# Patient Record
Sex: Male | Born: 1950 | Race: White | Hispanic: No | Marital: Married | Smoking: Never smoker
Health system: Southern US, Community
[De-identification: ages and names within clinical notes are randomized; demographics above are authoritative.]

## PROBLEM LIST (undated history)

## (undated) DIAGNOSIS — E079 Disorder of thyroid, unspecified: Secondary | ICD-10-CM

## (undated) DIAGNOSIS — E7800 Pure hypercholesterolemia, unspecified: Secondary | ICD-10-CM

## (undated) DIAGNOSIS — I350 Nonrheumatic aortic (valve) stenosis: Secondary | ICD-10-CM

## (undated) DIAGNOSIS — R011 Cardiac murmur, unspecified: Secondary | ICD-10-CM

## (undated) DIAGNOSIS — H05839 Thyrotoxicosis with diffuse goiter without thyrotoxic crisis or storm: Secondary | ICD-10-CM

## (undated) DIAGNOSIS — E0500 Thyrotoxicosis with diffuse goiter without thyrotoxic crisis or storm: Secondary | ICD-10-CM

## (undated) DIAGNOSIS — E119 Type 2 diabetes mellitus without complications: Secondary | ICD-10-CM

## (undated) DIAGNOSIS — I10 Essential (primary) hypertension: Secondary | ICD-10-CM

## (undated) HISTORY — PX: BACK SURGERY: SHX140

## (undated) HISTORY — PX: EYE SURGERY: SHX253

## (undated) HISTORY — PX: SHOULDER SURGERY: SHX246

## (undated) HISTORY — PX: OTHER SURGICAL HISTORY: SHX169

---

## 2006-05-04 ENCOUNTER — Ambulatory Visit: Payer: Self-pay | Admitting: Gastroenterology

## 2009-01-15 ENCOUNTER — Ambulatory Visit: Payer: Self-pay | Admitting: Internal Medicine

## 2009-02-02 ENCOUNTER — Ambulatory Visit: Payer: Self-pay | Admitting: Internal Medicine

## 2014-12-07 ENCOUNTER — Emergency Department: Payer: BLUE CROSS/BLUE SHIELD

## 2014-12-07 ENCOUNTER — Encounter: Payer: Self-pay | Admitting: *Deleted

## 2014-12-07 ENCOUNTER — Emergency Department
Admission: EM | Admit: 2014-12-07 | Discharge: 2014-12-07 | Disposition: A | Discharge status: Home / Self Care | Payer: BLUE CROSS/BLUE SHIELD | Attending: Emergency Medicine | Admitting: Emergency Medicine

## 2014-12-07 DIAGNOSIS — Y9389 Activity, other specified: Secondary | ICD-10-CM | POA: Insufficient documentation

## 2014-12-07 DIAGNOSIS — S3991XA Unspecified injury of abdomen, initial encounter: Secondary | ICD-10-CM | POA: Insufficient documentation

## 2014-12-07 DIAGNOSIS — S299XXA Unspecified injury of thorax, initial encounter: Secondary | ICD-10-CM | POA: Diagnosis present

## 2014-12-07 DIAGNOSIS — Z79899 Other long term (current) drug therapy: Secondary | ICD-10-CM | POA: Diagnosis not present

## 2014-12-07 DIAGNOSIS — Z794 Long term (current) use of insulin: Secondary | ICD-10-CM | POA: Insufficient documentation

## 2014-12-07 DIAGNOSIS — I10 Essential (primary) hypertension: Secondary | ICD-10-CM | POA: Diagnosis not present

## 2014-12-07 DIAGNOSIS — Y92410 Unspecified street and highway as the place of occurrence of the external cause: Secondary | ICD-10-CM | POA: Insufficient documentation

## 2014-12-07 DIAGNOSIS — E119 Type 2 diabetes mellitus without complications: Secondary | ICD-10-CM | POA: Insufficient documentation

## 2014-12-07 DIAGNOSIS — S2232XA Fracture of one rib, left side, initial encounter for closed fracture: Secondary | ICD-10-CM | POA: Diagnosis not present

## 2014-12-07 DIAGNOSIS — Y998 Other external cause status: Secondary | ICD-10-CM | POA: Diagnosis not present

## 2014-12-07 HISTORY — DX: Type 2 diabetes mellitus without complications: E11.9

## 2014-12-07 HISTORY — DX: Pure hypercholesterolemia, unspecified: E78.00

## 2014-12-07 HISTORY — DX: Essential (primary) hypertension: I10

## 2014-12-07 LAB — CBC
HEMATOCRIT: 41 % (ref 40.0–52.0)
Hemoglobin: 13.8 g/dL (ref 13.0–18.0)
MCH: 30.5 pg (ref 26.0–34.0)
MCHC: 33.7 g/dL (ref 32.0–36.0)
MCV: 90.5 fL (ref 80.0–100.0)
PLATELETS: 236 10*3/uL (ref 150–440)
RBC: 4.53 MIL/uL (ref 4.40–5.90)
RDW: 14.2 % (ref 11.5–14.5)
WBC: 8.9 10*3/uL (ref 3.8–10.6)

## 2014-12-07 LAB — COMPREHENSIVE METABOLIC PANEL
ALT: 27 U/L (ref 17–63)
ANION GAP: 9 (ref 5–15)
AST: 23 U/L (ref 15–41)
Albumin: 4.2 g/dL (ref 3.5–5.0)
Alkaline Phosphatase: 49 U/L (ref 38–126)
BILIRUBIN TOTAL: 0.6 mg/dL (ref 0.3–1.2)
BUN: 24 mg/dL — ABNORMAL HIGH (ref 6–20)
CO2: 25 mmol/L (ref 22–32)
Calcium: 9.2 mg/dL (ref 8.9–10.3)
Chloride: 106 mmol/L (ref 101–111)
Creatinine, Ser: 1.01 mg/dL (ref 0.61–1.24)
GFR calc Af Amer: >60 mL/min (ref >60)
Glucose, Bld: 256 mg/dL — ABNORMAL HIGH (ref 65–99)
POTASSIUM: 4.3 mmol/L (ref 3.5–5.1)
Sodium: 140 mmol/L (ref 135–145)
TOTAL PROTEIN: 7.2 g/dL (ref 6.5–8.1)

## 2014-12-07 MED ORDER — OXYCODONE-ACETAMINOPHEN 10-325 MG PO TABS: 1.0000 | ORAL_TABLET | Freq: Four times a day (QID) | ORAL | Status: AC | PRN

## 2014-12-07 MED ORDER — MELOXICAM 15 MG PO TABS: 15.0000 mg | ORAL_TABLET | Freq: Every day | ORAL | Status: DC

## 2014-12-07 MED ORDER — IOHEXOL 350 MG/ML SOLN
100.0000 mL | Freq: Once | INTRAVENOUS | Status: AC | PRN
  Administered 2014-12-07: 100 mL via INTRAVENOUS
  Filled 2014-12-07: qty 100

## 2014-12-07 MED ORDER — ONDANSETRON HCL 4 MG/2ML IJ SOLN
4.0000 mg | Freq: Once | INTRAMUSCULAR | Status: AC
  Administered 2014-12-07: 4 mg via INTRAVENOUS
  Filled 2014-12-07: qty 2

## 2014-12-07 MED ORDER — SODIUM CHLORIDE 0.9 % IV BOLUS (SEPSIS)
1000.0000 mL | Freq: Once | INTRAVENOUS | Status: AC
  Administered 2014-12-07: 1000 mL via INTRAVENOUS

## 2014-12-07 MED ORDER — MORPHINE SULFATE (PF) 4 MG/ML IV SOLN
4.0000 mg | Freq: Once | INTRAVENOUS | Status: AC
  Administered 2014-12-07: 4 mg via INTRAVENOUS
  Filled 2014-12-07: qty 1
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
## 2017-02-10 ENCOUNTER — Other Ambulatory Visit: Payer: Self-pay | Admitting: *Deleted

## 2017-02-10 DIAGNOSIS — R011 Cardiac murmur, unspecified: Secondary | ICD-10-CM

## 2017-02-16 ENCOUNTER — Encounter: Payer: Self-pay | Admitting: *Deleted

## 2017-02-17 ENCOUNTER — Encounter
Admission: RE | Disposition: A | Discharge status: Home / Self Care | Payer: Self-pay | Source: Ambulatory Visit | Attending: Internal Medicine

## 2017-02-17 ENCOUNTER — Ambulatory Visit
Admission: RE | Admit: 2017-02-17 | Discharge: 2017-02-17 | Disposition: A | Discharge status: Home / Self Care | Payer: Medicare Other | Source: Ambulatory Visit | Attending: Internal Medicine | Admitting: Internal Medicine

## 2017-02-17 ENCOUNTER — Ambulatory Visit: Payer: Medicare Other | Admitting: Anesthesiology

## 2017-02-17 ENCOUNTER — Other Ambulatory Visit: Payer: Self-pay

## 2017-02-17 ENCOUNTER — Encounter: Payer: Self-pay | Admitting: *Deleted

## 2017-02-17 DIAGNOSIS — I10 Essential (primary) hypertension: Secondary | ICD-10-CM | POA: Diagnosis not present

## 2017-02-17 DIAGNOSIS — E119 Type 2 diabetes mellitus without complications: Secondary | ICD-10-CM | POA: Insufficient documentation

## 2017-02-17 DIAGNOSIS — Z1211 Encounter for screening for malignant neoplasm of colon: Secondary | ICD-10-CM | POA: Diagnosis present

## 2017-02-17 DIAGNOSIS — Z7982 Long term (current) use of aspirin: Secondary | ICD-10-CM | POA: Insufficient documentation

## 2017-02-17 DIAGNOSIS — Z79899 Other long term (current) drug therapy: Secondary | ICD-10-CM | POA: Insufficient documentation

## 2017-02-17 DIAGNOSIS — K648 Other hemorrhoids: Secondary | ICD-10-CM | POA: Diagnosis not present

## 2017-02-17 DIAGNOSIS — E7800 Pure hypercholesterolemia, unspecified: Secondary | ICD-10-CM | POA: Insufficient documentation

## 2017-02-17 DIAGNOSIS — Z794 Long term (current) use of insulin: Secondary | ICD-10-CM | POA: Insufficient documentation

## 2017-02-17 DIAGNOSIS — R011 Cardiac murmur, unspecified: Secondary | ICD-10-CM | POA: Diagnosis not present

## 2017-02-17 HISTORY — PX: COLONOSCOPY WITH PROPOFOL: SHX5780

## 2017-02-17 HISTORY — DX: Cardiac murmur, unspecified: R01.1

## 2017-02-17 LAB — GLUCOSE, CAPILLARY: GLUCOSE-CAPILLARY: 122 mg/dL — AB (ref 65–99)

## 2017-02-17 SURGERY — COLONOSCOPY WITH PROPOFOL
Anesthesia: General

## 2017-02-17 MED ORDER — PROPOFOL 500 MG/50ML IV EMUL
INTRAVENOUS | Status: AC
  Filled 2017-02-17: qty 50

## 2017-02-17 MED ORDER — LIDOCAINE HCL (PF) 2 % IJ SOLN
INTRAMUSCULAR | Status: AC
  Filled 2017-02-17: qty 10

## 2017-02-17 MED ORDER — LIDOCAINE HCL (CARDIAC) 20 MG/ML IV SOLN
INTRAVENOUS | Status: DC | PRN
  Administered 2017-02-17: 100 mg via INTRAVENOUS

## 2017-02-17 MED ORDER — SODIUM CHLORIDE 0.9 % IV SOLN
INTRAVENOUS | Status: DC
  Administered 2017-02-17: 07:00:00 via INTRAVENOUS

## 2017-02-17 MED ORDER — PROPOFOL 10 MG/ML IV BOLUS
INTRAVENOUS | Status: DC | PRN
  Administered 2017-02-17: 90 mg via INTRAVENOUS

## 2017-02-17 MED ORDER — PROPOFOL 500 MG/50ML IV EMUL
INTRAVENOUS | Status: DC | PRN
  Administered 2017-02-17: 125 ug/kg/min via INTRAVENOUS

## 2017-02-17 NOTE — Interval H&P Note (Signed)
History and Physical Interval Note:  02/17/2017 7:56 AM  Frank Mckee  has presented today for surgery, with the diagnosis of COLON CANCER SCREEN  The various methods of treatment have been discussed with the patient and family. After consideration of risks, benefits and other options for treatment, the patient has consented to  Procedure(s): COLONOSCOPY WITH PROPOFOL (N/A) as a surgical intervention .  The patient's history has been reviewed, patient examined, no change in status, stable for surgery.  I have reviewed the patient's chart and labs.  Questions were answered to the patient's satisfaction.     [place], [place]

## 2017-02-17 NOTE — Anesthesia Preprocedure Evaluation (Signed)
Anesthesia Evaluation  Patient identified by MRN, date of birth, ID band Patient awake    Reviewed: Allergy & Precautions, H&P , NPO status , Patient's Chart, lab work & pertinent test results, reviewed documented beta blocker date and time   History of Anesthesia Complications Negative for: history of anesthetic complications  Airway Mallampati: III  TM Distance: >3 FB Neck ROM: full    Dental  (+) Dental Advidsory Given, Missing, Poor Dentition   Pulmonary neg pulmonary ROS,           Cardiovascular Exercise Tolerance: Good hypertension, (-) angina(-) CAD, (-) Past MI, (-) Cardiac Stents and (-) CABG (-) dysrhythmias + Valvular Problems/Murmurs      Neuro/Psych negative neurological ROS  negative psych ROS   GI/Hepatic negative GI ROS, Neg liver ROS,   Endo/Other  diabetes  Renal/GU negative Renal ROS  negative genitourinary   Musculoskeletal   Abdominal   Peds  Hematology negative hematology ROS (+)   Anesthesia Other Findings Past Medical History: No date: Diabetes mellitus without complication ([facility]) No date: Heart murmur No date: Hypercholesteremia No date: Hypertension   Reproductive/Obstetrics negative OB ROS                             Anesthesia Physical Anesthesia Plan  ASA: III  Anesthesia Plan: General   Post-op Pain Management:    Induction: Intravenous  PONV Risk Score and Plan: 2 and Propofol infusion  Airway Management Planned: Nasal Cannula  Additional Equipment:   Intra-op Plan:   Post-operative Plan:   Informed Consent: I have reviewed the patients History and Physical, chart, labs and discussed the procedure including the risks, benefits and alternatives for the proposed anesthesia with the patient or authorized representative who has indicated his/her understanding and acceptance.   Dental Advisory Given  Plan Discussed with: Anesthesiologist, CRNA  and Surgeon  Anesthesia Plan Comments:         Anesthesia Quick Evaluation

## 2017-02-17 NOTE — Transfer of Care (Signed)
Immediate Anesthesia Transfer of Care Note  Patient: Frank Mckee  Procedure(s) Performed: COLONOSCOPY WITH PROPOFOL (N/A )  Patient Location: Endoscopy Unit  Anesthesia Type:General  Level of Consciousness: sedated  Airway & Oxygen Therapy: Patient Spontanous Breathing and Patient connected to nasal cannula oxygen  Post-op Assessment: Report given to RN and Post -op Vital signs reviewed and stable  Post vital signs: Reviewed and stable  Last Vitals:  Vitals:   02/17/17 0704 02/17/17 0816  BP: 133/69 (!) 96/43  Pulse: 63 64  Resp: 16 11  Temp: (!) 35.9 C (!) 36.3 C  SpO2: 96% 97%    Last Pain:  Vitals:   02/17/17 0816  TempSrc: Tympanic         Complications: No apparent anesthesia complications

## 2017-02-17 NOTE — Anesthesia Postprocedure Evaluation (Signed)
Anesthesia Post Note  Patient: Frank Mckee  Procedure(s) Performed: COLONOSCOPY WITH PROPOFOL (N/A )  Patient location during evaluation: Endoscopy Anesthesia Type: General Level of consciousness: awake and alert Pain management: pain level controlled Vital Signs Assessment: post-procedure vital signs reviewed and stable Respiratory status: spontaneous breathing, nonlabored ventilation, respiratory function stable and patient connected to nasal cannula oxygen Cardiovascular status: blood pressure returned to baseline and stable Postop Assessment: no apparent nausea or vomiting Anesthetic complications: no     Last Vitals:  Vitals:   02/17/17 0826 02/17/17 0837  BP: (!) 102/45 (!) 129/59  Pulse: (!) 58 60  Resp: 12 18  Temp:    SpO2: 98% 96%    Last Pain:  Vitals:   02/17/17 0816  TempSrc: Tympanic                 Martha Clan

## 2017-02-17 NOTE — H&P (Signed)
Outpatient short stay form Pre-procedure 02/17/2017 7:55 AM Teodoro K. Alice Reichert, M.D.  Primary Physician: Thereasa Distance, M.D.  Reason for visit:  Colon cancer screening  History of present illness:  Patient presents for colon cancer screening. The patient denies abdominal pain, abnormal weight loss or rectal bleeding.     Current Facility-Administered Medications:  .  0.9 %  sodium chloride infusion, , Intravenous, Continuous, Minor, Benay Pike, MD, Last Rate: 20 mL/hr at 02/17/17 8032  Medications Prior to Admission  Medication Sig Dispense Refill Last Dose  . aspirin EC 81 MG tablet Take 81 mg by mouth daily.   Past Week at Unknown time  . ezetimibe (ZETIA) 10 MG tablet Take 10 mg by mouth daily.   02/16/2017 at Unknown time  . fexofenadine (ALLEGRA) 180 MG tablet Take 180 mg by mouth daily.   02/16/2017 at Unknown time  . losartan (COZAAR) 50 MG tablet Take 50 mg by mouth daily.   02/16/2017 at Unknown time  . meloxicam (MOBIC) 15 MG tablet Take 1 tablet (15 mg total) by mouth daily. 30 tablet 0 Past Week at Unknown time  . Multiple Vitamin (MULTIVITAMIN) tablet Take 1 tablet by mouth daily.   Past Week at Unknown time  . insulin aspart (NOVOLOG) 100 UNIT/ML injection Inject into the skin 3 (three) times daily before meals.        No Known Allergies   Past Medical History:  Diagnosis Date  . Diabetes mellitus without complication ([place])   . Heart murmur   . Hypercholesteremia   . Hypertension     Review of systems:      Physical Exam  General appearance: alert, cooperative and appears stated age Resp: clear to auscultation bilaterally Cardio: regular rate and rhythm, S1, S2 normal, no murmur, click, rub or gallop GI: soft, non-tender; bowel sounds normal; no masses,  no organomegaly Extremities: extremities normal, atraumatic, no cyanosis or edema     Planned procedures: Colonoscopy. The patient understands the nature of the planned procedure, indications, risks,  alternatives and potential complications including but not limited to bleeding, infection, perforation, damage to internal organs and possible oversedation/side effects from anesthesia. The patient agrees and gives consent to proceed.  Please refer to procedure notes for findings, recommendations and patient disposition/instructions.    Teodoro K. Alice Reichert, M.D. Gastroenterology 02/17/2017  7:55 AM

## 2017-02-17 NOTE — Op Note (Signed)
[facility] Gastroenterology Patient Name: Frank Mckee Procedure Date: 02/17/2017 7:32 AM MRN: [id number] Account #: [id number] Date of Birth: 1951-01-28 Admit Type: Outpatient Age: 67 Room: [facility] ENDO ROOM 3 Gender: Male Note Status: Finalized Procedure:            Colonoscopy Indications:          Screening for colorectal malignant neoplasm Providers:            Benay Pike. Quita Mcgrory MD, MD Medicines:            Propofol per Anesthesia Complications:        No immediate complications. Procedure:            Pre-Anesthesia Assessment:                       - The risks and benefits of the procedure and the                        sedation options and risks were discussed with the                        patient. All questions were answered and informed                        consent was obtained.                       - Patient identification and proposed procedure were                        verified prior to the procedure by the nurse. The                        procedure was verified in the procedure room.                       - ASA Grade Assessment: II - A patient with mild                        systemic disease.                       - After reviewing the risks and benefits, the patient                        was deemed in satisfactory condition to undergo the                        procedure.                       After obtaining informed consent, the colonoscope was                        passed under direct vision. Throughout the procedure,                        the patient's blood pressure, pulse, and oxygen                        saturations were monitored continuously. The  Colonoscope was introduced through the anus and                        advanced to the the cecum, identified by appendiceal                        orifice and ileocecal valve. The colonoscopy was                        performed without difficulty. The patient  tolerated the                        procedure well. The quality of the bowel preparation                        was good. Findings:      The perianal and digital rectal examinations were normal. Pertinent       negatives include normal sphincter tone and no palpable rectal lesions.      The colon (entire examined portion) appeared normal.      Non-bleeding internal hemorrhoids were found during retroflexion. The       hemorrhoids were mild. Impression:           - The entire examined colon is normal.                       - Non-bleeding internal hemorrhoids.                       - No specimens collected. Recommendation:       - Repeat colonoscopy in 10 years for screening purposes.                       - Return to GI office PRN. Procedure Code(s):    --- Professional ---                       O2947, Colorectal cancer screening; colonoscopy on                        individual not meeting criteria for high risk Diagnosis Code(s):    --- Professional ---                       Z12.11, Encounter for screening for malignant neoplasm                        of colon                       K64.8, Other hemorrhoids CPT copyright 2016 American Medical Association. All rights reserved. The codes documented in this report are preliminary and upon coder review may  be revised to meet current compliance requirements. Efrain Sella MD, MD 02/17/2017 8:16:33 AM This report has been signed electronically. Number of Addenda: 0 Note Initiated On: 02/17/2017 7:32 AM Scope Withdrawal Time: 0 hours 6 minutes 12 seconds  Total Procedure Duration: 0 hours 10 minutes 14 seconds       [facility]

## 2017-02-17 NOTE — Anesthesia Post-op Follow-up Note (Signed)
Anesthesia QCDR form completed.        

## 2017-02-18 ENCOUNTER — Encounter: Payer: Self-pay | Admitting: Internal Medicine

## 2017-03-10 ENCOUNTER — Ambulatory Visit (INDEPENDENT_AMBULATORY_CARE_PROVIDER_SITE_OTHER): Payer: Medicare Other

## 2017-03-10 ENCOUNTER — Other Ambulatory Visit: Payer: Self-pay

## 2017-03-10 DIAGNOSIS — R011 Cardiac murmur, unspecified: Secondary | ICD-10-CM

## 2017-03-10 LAB — ECHOCARDIOGRAM COMPLETE
AO mean calculated velocity dopler: 126 cm/s
AV Area VTI index: 1.13 cm2/m2
AV Area mean vel: 2.1 cm2
AV Mean grad: 9 mmHg
AV VEL mean LVOT/AV: 0.55
AVAREAMEANVIN: 1.05 cm2/m2
Ao-asc: 30 cm
Area-P 1/2: 3.79 cm2
CHL CUP AV VEL: 2.25
CHL CUP STROKE VOLUME: 67 mL
E decel time: 197 msec
EERAT: 13.37
FS: 34 % (ref 28–44)
IVS/LV PW RATIO, ED: 0.92
LA ID, A-P, ES: 40 mm
LA diam end sys: 40 mm
LA vol index: 39.3 mL/m2
LADIAMINDEX: 2 cm/m2
LAVOL: 78.6 mL
LAVOLA4C: 104 mL
LV E/e' medial: 13.37
LV E/e'average: 13.37
LV TDI E'MEDIAL: 8.92
LV sys vol index: 28 mL/m2
LV sys vol: 56 mL (ref 21–61)
LVDIAVOL: 123 mL (ref 62–150)
LVDIAVOLIN: 62 mL/m2
LVELAT: 7.18 cm/s
LVOT VTI: 22.9 cm
LVOT area: 3.8 cm2
LVOT peak VTI: 0.59 cm
LVOTD: 22 mm
LVOTSV: 87 mL
Lateral S' vel: 22.2 cm/s
MV Dec: 197
MV pk E vel: 96 m/s
MVPG: 4 mmHg
MVPKAVEL: 102 m/s
P 1/2 time: 58 ms
PW: 13 mm — AB (ref 0.6–1.1)
RV TAPSE: 29.8 mm
Simpson's disk: 54
TDI e' lateral: 7.18
VTI: 38.7 cm
Valve area index: 1.13
Valve area: 2.25 cm2

## 2017-08-16 ENCOUNTER — Encounter: Payer: Self-pay | Admitting: Emergency Medicine

## 2017-08-16 ENCOUNTER — Other Ambulatory Visit: Payer: Self-pay

## 2017-08-16 ENCOUNTER — Ambulatory Visit
Admission: EM | Admit: 2017-08-16 | Discharge: 2017-08-16 | Disposition: A | Discharge status: Home / Self Care | Payer: Medicare Other | Attending: Family Medicine | Admitting: Family Medicine

## 2017-08-16 DIAGNOSIS — H6022 Malignant otitis externa, left ear: Secondary | ICD-10-CM | POA: Diagnosis not present

## 2017-08-16 MED ORDER — NEOMYCIN-POLYMYXIN-HC 3.5-10000-1 OT SUSP: 4.0000 [drp] | Freq: Three times a day (TID) | OTIC | 0 refills | Status: DC

## 2017-08-16 NOTE — ED Triage Notes (Signed)
Patient c/o left ear pain that started 1 week ago. Denies cold symptoms, denies fever.

## 2017-08-16 NOTE — ED Provider Notes (Signed)
[facility]    CSN: [id number] Arrival date & time: 08/16/17  0959     History   Chief Complaint Chief Complaint  Patient presents with  . Otalgia    HPI Frank Mckee is a 67 y.o. male.   The history is provided by the patient.  Otalgia  Location:  Left Behind ear:  No abnormality Quality:  Aching Severity:  Moderate Onset quality:  Sudden Duration:  1 week Timing:  Constant Progression:  Worsening Chronicity:  New Context: not direct blow, not elevation change, not foreign body in ear, not loud noise, not recent URI and not water in ear   Relieved by:  None tried Worsened by:  Nothing Ineffective treatments:  None tried Associated symptoms: no abdominal pain, no congestion, no cough, no diarrhea, no ear discharge, no fever, no headaches, no hearing loss, no neck pain, no rash, no rhinorrhea, no sore throat, no tinnitus and no vomiting   Risk factors: no recent travel, no chronic ear infection and no prior ear surgery     Past Medical History:  Diagnosis Date  . Diabetes mellitus without complication ([place])   . Heart murmur   . Hypercholesteremia   . Hypertension     There are no active problems to display for this patient.   Past Surgical History:  Procedure Laterality Date  . arm surgery    . BACK SURGERY    . COLONOSCOPY WITH PROPOFOL N/A 02/17/2017   Procedure: COLONOSCOPY WITH PROPOFOL;  Surgeon: Toledo, Benay Pike, MD;  Location: [facility] ENDOSCOPY;  Service: Gastroenterology;  Laterality: N/A;  . EYE SURGERY     cataract extraction  . SHOULDER SURGERY         Home Medications    Prior to Admission medications   Medication Sig Start Date End Date Taking? Authorizing Provider  aspirin EC 81 MG tablet Take 81 mg by mouth daily.   Yes [provider]  ezetimibe (ZETIA) 10 MG tablet Take 10 mg by mouth daily.   Yes [provider]  fexofenadine (ALLEGRA) 180 MG tablet Take 180 mg by mouth daily.   Yes [provider]  insulin aspart (NOVOLOG) 100 UNIT/ML injection Inject into the skin 3 (three) times daily before meals.   Yes [provider]  losartan (COZAAR) 50 MG tablet Take 50 mg by mouth daily.   Yes [provider]  Multiple Vitamin (MULTIVITAMIN) tablet Take 1 tablet by mouth daily.   Yes [provider]  meloxicam (MOBIC) 15 MG tablet Take 1 tablet (15 mg total) by mouth daily. 12/07/14   Cuthriell, Charline Bills, PA-C  neomycin-polymyxin-hydrocortisone (CORTISPORIN) 3.5-10000-1 OTIC suspension Place 4 drops into the left ear 3 (three) times daily. 08/16/17   Norval Gable, MD    Family History Family History  Problem Relation Age of Onset  . Healthy Mother   . Diabetes Father   . Hypertension Father     Social History Social History   Tobacco Use  . Smoking status: Never Smoker  . Smokeless tobacco: Never Used  Substance Use Topics  . Alcohol use: No  . Drug use: No     Allergies   Patient has no known allergies.   Review of Systems Review of Systems  Constitutional: Negative for fever.  HENT: Positive for ear pain. Negative for congestion, ear discharge, hearing loss, rhinorrhea, sore throat and tinnitus.   Respiratory: Negative for cough.   Gastrointestinal: Negative for abdominal pain, diarrhea and vomiting.  Musculoskeletal: Negative  for neck pain.  Skin: Negative for rash.  Neurological: Negative for headaches.     Physical Exam Triage Vital Signs ED Triage Vitals  Enc Vitals Group     BP 08/16/17 1022 (!) 151/64     Pulse Rate 08/16/17 1022 61     Resp 08/16/17 1022 18     Temp 08/16/17 1022 98.2 F (36.8 C)     Temp Source 08/16/17 1022 Oral     SpO2 08/16/17 1022 97 %     Weight 08/16/17 1018 190 lb (86.2 kg)     Height 08/16/17 1018 5\' 9"  (1.753 m)     Head Circumference --      Peak Flow --      Pain Score 08/16/17 1017 5     Pain Loc --      Pain Edu? --      Excl. in [place]? --    No data found.  Updated Vital  Signs BP (!) 151/64 (BP Location: Left Arm)   Pulse 61   Temp 98.2 F (36.8 C) (Oral)   Resp 18   Ht 5\' 9"  (1.753 m)   Wt 190 lb (86.2 kg)   SpO2 97%   BMI 28.06 kg/m   Visual Acuity Right Eye Distance:   Left Eye Distance:   Bilateral Distance:    Right Eye Near:   Left Eye Near:    Bilateral Near:     Physical Exam  Constitutional: He appears well-developed and well-nourished. No distress.  HENT:  Head: Normocephalic and atraumatic.  Nose: Nose normal.  Mouth/Throat: Uvula is midline, oropharynx is clear and moist and mucous membranes are normal. No tonsillar exudate.  Left ear canal swollen, erythematous and drainage  Skin: He is not diaphoretic.  Nursing note and vitals reviewed.    UC Treatments / Results  Labs (all labs ordered are listed, but only abnormal results are displayed) Labs Reviewed - No data to display  EKG None  Radiology No results found.  Procedures Procedures (including critical care time)  Medications Ordered in UC Medications - No data to display  Initial Impression / Assessment and Plan / UC Course  I have reviewed the triage vital signs and the nursing notes.  Pertinent labs & imaging results that were available during my care of the patient were reviewed by me and considered in my medical decision making (see chart for details).      Final Clinical Impressions(s) / UC Diagnoses   Final diagnoses:  Acute malignant otitis externa of left ear    ED Prescriptions    Medication Sig Dispense Auth. Provider   neomycin-polymyxin-hydrocortisone (CORTISPORIN) 3.5-10000-1 OTIC suspension Place 4 drops into the left ear 3 (three) times daily. 10 mL Norval Gable, MD     1. diagnosis reviewed with patient 2. rx as per orders above; reviewed possible side effects, interactions, risks and benefits  3. Recommend supportive treatment with otc analgesics prn 4. Follow-up prn if symptoms worsen or don't improve  Controlled Substance  Prescriptions [place] Controlled Substance Registry consulted? Not Applicable   Norval Gable, MD 08/16/17 1155

## 2018-02-14 ENCOUNTER — Other Ambulatory Visit: Payer: Self-pay | Admitting: Orthopedic Surgery

## 2018-02-14 DIAGNOSIS — M25511 Pain in right shoulder: Secondary | ICD-10-CM

## 2018-02-15 ENCOUNTER — Other Ambulatory Visit: Payer: Self-pay | Admitting: Orthopedic Surgery

## 2018-02-15 DIAGNOSIS — M25511 Pain in right shoulder: Secondary | ICD-10-CM

## 2018-02-24 ENCOUNTER — Ambulatory Visit
Admission: RE | Admit: 2018-02-24 | Discharge: 2018-02-24 | Disposition: A | Discharge status: Home / Self Care | Payer: Medicare Other | Source: Ambulatory Visit | Attending: Orthopedic Surgery | Admitting: Orthopedic Surgery

## 2018-02-24 DIAGNOSIS — M25511 Pain in right shoulder: Secondary | ICD-10-CM

## 2019-05-15 ENCOUNTER — Telehealth: Payer: Self-pay | Admitting: Cardiovascular Disease

## 2019-05-15 DIAGNOSIS — R000 Tachycardia, unspecified: Secondary | ICD-10-CM

## 2019-05-15 DIAGNOSIS — M7989 Other specified soft tissue disorders: Secondary | ICD-10-CM

## 2019-05-15 NOTE — Telephone Encounter (Signed)
Patient received referral from PCP office to be seen by cardiologist. He does have appointment coming up on 05/29/19 with Dr. Rockey Situ and verbal orders given for long term monitor and echocardiogram. Will enter orders and then send to scheduling to assist with getting these done.

## 2019-05-17 ENCOUNTER — Other Ambulatory Visit: Payer: Self-pay

## 2019-05-17 ENCOUNTER — Ambulatory Visit (INDEPENDENT_AMBULATORY_CARE_PROVIDER_SITE_OTHER): Payer: Medicare Other

## 2019-05-17 DIAGNOSIS — R000 Tachycardia, unspecified: Secondary | ICD-10-CM

## 2019-05-17 DIAGNOSIS — I350 Nonrheumatic aortic (valve) stenosis: Secondary | ICD-10-CM | POA: Diagnosis not present

## 2019-05-17 DIAGNOSIS — E119 Type 2 diabetes mellitus without complications: Secondary | ICD-10-CM

## 2019-05-17 DIAGNOSIS — E785 Hyperlipidemia, unspecified: Secondary | ICD-10-CM

## 2019-05-17 DIAGNOSIS — I359 Nonrheumatic aortic valve disorder, unspecified: Secondary | ICD-10-CM | POA: Diagnosis not present

## 2019-05-17 DIAGNOSIS — M7989 Other specified soft tissue disorders: Secondary | ICD-10-CM

## 2019-05-17 DIAGNOSIS — R609 Edema, unspecified: Secondary | ICD-10-CM | POA: Diagnosis not present

## 2019-05-17 DIAGNOSIS — I10 Essential (primary) hypertension: Secondary | ICD-10-CM

## 2019-05-18 NOTE — Telephone Encounter (Signed)
Monitor fell off and [place] and [place] to [place] on Sunday. Will have another monitor mailed to him which should be there when he returns home. Advised that he just mails the other one back when he returns home. He verbalized understanding with no further questions at this time.

## 2019-05-24 ENCOUNTER — Other Ambulatory Visit: Payer: Self-pay

## 2019-05-24 ENCOUNTER — Encounter: Payer: Self-pay | Admitting: Cardiovascular Disease

## 2019-05-24 ENCOUNTER — Ambulatory Visit (INDEPENDENT_AMBULATORY_CARE_PROVIDER_SITE_OTHER): Payer: Medicare Other | Admitting: Cardiovascular Disease

## 2019-05-24 VITALS — BP 122/56 | HR 95 | Ht 69.0 in | Wt 196.5 lb

## 2019-05-24 DIAGNOSIS — I10 Essential (primary) hypertension: Secondary | ICD-10-CM

## 2019-05-24 DIAGNOSIS — E119 Type 2 diabetes mellitus without complications: Secondary | ICD-10-CM

## 2019-05-24 DIAGNOSIS — E782 Mixed hyperlipidemia: Secondary | ICD-10-CM

## 2019-05-24 DIAGNOSIS — E0590 Thyrotoxicosis, unspecified without thyrotoxic crisis or storm: Secondary | ICD-10-CM

## 2019-05-24 DIAGNOSIS — I479 Paroxysmal tachycardia, unspecified: Secondary | ICD-10-CM

## 2019-05-24 DIAGNOSIS — I350 Nonrheumatic aortic (valve) stenosis: Secondary | ICD-10-CM

## 2019-05-24 DIAGNOSIS — Z794 Long term (current) use of insulin: Secondary | ICD-10-CM

## 2019-05-24 MED ORDER — PROPRANOLOL HCL ER 60 MG PO CP24: 60.0000 mg | ORAL_CAPSULE | Freq: Every day | ORAL | 3 refills | Status: DC

## 2019-05-24 MED ORDER — FUROSEMIDE 20 MG PO TABS: 20.0000 mg | ORAL_TABLET | Freq: Every day | ORAL | 3 refills | Status: DC | PRN

## 2019-05-24 NOTE — Progress Notes (Addendum)
Cardiology Office Note  Date:  05/24/2019   ID:  Frank Mckee, Frank Mckee 03-15-1950, MRN [id number]  PCP:  Sofie Hartigan, MD   Chief Complaint  Patient presents with  . other    Follow up from Echo results. Meds reviewed by the pt. verbally. Pt. c/o rapid heart beats and shortness of breath.     HPI:  [name] is a 69 year old gentleman with past medical history of Diabetes type 1, on insulin pump Hyperlipidemia Hypertension Thyroid disease Who presents by referral from primary care for tachycardia, shortness of breath  Recent visit to primary care with symptoms of shortness of breath and tachycardia Reported heart rate typically 60s, since March it has been running 90-100 Since his procedure for Dupuytren's contracture heart rate has been elevated Notes from primary care indicating he has a history of aortic valve stenosis  Recent lab work reviewed TSH less than 0.01, performed May 15, 2019 by primary care Free T4 3.44 Hemoglobin A1c 6.1 Total cholesterol 185 LDL 124 Prior cholesterol 118 in August 2017  He reports that he has lost 10 pounds in the past month  It appears she was started on methimazole 10 mg daily by endocrine on May 17 2019 1 week on methimazole,Tremor little better, Less tachycardia Still does not feel he is at baseline  Shortness of breath pressure up into his throat when bending over lifting something heavy with tachycardia as well  EKG personally reviewed by myself on todays visit Shows normal sinus rhythm rate 95 bpm no significant ST-T wave changes  Testing reviewed with him in detail Echocardiogram May 17, 2019 1. Left ventricular ejection fraction, by estimation, is 60 to 65%. The  left ventricle has normal function. The left ventricle has no regional  wall motion abnormalities. There is mild left ventricular hypertrophy.  Left ventricular diastolic parameters  are consistent with Grade II diastolic dysfunction  (pseudonormalization).  2. Right ventricular systolic function is normal. The right ventricular  size is mildly enlarged. There is mildly elevated pulmonary artery  systolic pressure.  3. Left atrial size was mildly dilated.  4. The mitral valve is grossly normal. Trivial mitral valve  regurgitation.  5. The aortic valve is tricuspid. Aortic valve regurgitation is not  visualized. Moderate to severe aortic valve stenosis. Aortic valve mean  gradient measures 36.0 mmHg. Aortic valve Vmax measures 3.95 m/s.  Echocardiogram 2019 Peak velocity (S): 252 cm/s. Mean gradient  (S): 14 mm Hg. Peak gradient (S): 25 mm Hg.   CT scan November 2016 chest abdomen pelvis with no significant coronary calcification, minimal abdominal aortic calcification  PMH:   has a past medical history of Diabetes mellitus without complication ([place]), Heart murmur, Hypercholesteremia, and Hypertension.  PSH:    Past Surgical History:  Procedure Laterality Date  . arm surgery    . BACK SURGERY    . COLONOSCOPY WITH PROPOFOL N/A 02/17/2017   Procedure: COLONOSCOPY WITH PROPOFOL;  Surgeon: Toledo, Benay Pike, MD;  Location: [facility] ENDOSCOPY;  Service: Gastroenterology;  Laterality: N/A;  . EYE SURGERY     cataract extraction  . SHOULDER SURGERY      Current Outpatient Medications  Medication Sig Dispense Refill  . aspirin EC 81 MG tablet Take 81 mg by mouth daily.    Marland Kitchen ezetimibe (ZETIA) 10 MG tablet Take 10 mg by mouth daily.    . fexofenadine (ALLEGRA) 180 MG tablet Take 180 mg by mouth daily.    . insulin aspart (NOVOLOG) 100 UNIT/ML injection  Inject into the skin 3 (three) times daily before meals.    Marland Kitchen losartan (COZAAR) 50 MG tablet Take 0.5 tablet (25 mg) by mouth once daily in the morning    . meloxicam (MOBIC) 15 MG tablet Take 1 tablet (15 mg total) by mouth daily. 30 tablet 0  . methimazole (TAPAZOLE) 10 MG tablet Take 10 mg by mouth daily.    . Multiple Vitamin (MULTIVITAMIN) tablet Take 1 tablet by  mouth daily.    . naftifine (NAFTIN) 1 % cream Apply topically.    . tadalafil (CIALIS) 20 MG tablet Take by mouth.    . tamsulosin (FLOMAX) 0.4 MG CAPS capsule Take 0.4 mg by mouth daily.    Marland Kitchen triamcinolone cream (KENALOG) 0.1 % Apply topically.    . propranolol ER (INDERAL LA) 60 MG 24 hr capsule Take 1 capsule (60 mg total) by mouth daily. 30 capsule 3   No current facility-administered medications for this visit.     Allergies:   Atorvastatin   Social History:  The patient  reports that he has never smoked. He has never used smokeless tobacco. He reports that he does not drink alcohol or use drugs.   Family History:   family history includes Diabetes in his father; Healthy in his mother; Hypertension in his father.    Review of Systems: Review of Systems  Constitutional: Negative.   HENT: Negative.   Respiratory: Positive for shortness of breath.   Cardiovascular: Positive for palpitations.  Gastrointestinal: Negative.   Musculoskeletal: Negative.   Neurological: Positive for tremors.  Psychiatric/Behavioral: Negative.   All other systems reviewed and are negative.   PHYSICAL EXAM: VS:  BP (!) 122/56 (BP Location: Left Arm, Patient Position: Sitting, Cuff Size: Normal)   Pulse 95   Ht 5\' 9"  (1.753 m)   Wt 196 lb 8 oz (89.1 kg)   SpO2 98%   BMI 29.02 kg/m  , BMI Body mass index is 29.02 kg/m. GEN: Well nourished, well developed, in no acute distress HEENT: normal Neck: no JVD, carotid bruits, or masses Cardiac: RRR; no murmurs, rubs, or gallops,no edema  Respiratory:  clear to auscultation bilaterally, normal work of breathing GI: soft, nontender, nondistended, + BS MS: no deformity or atrophy Skin: warm and dry, no rash Neuro:  Strength and sensation are intact Psych: euthymic mood, full affect   Recent Labs: No results found for requested labs within last 8760 hours.    Lipid Panel No results found for: CHOL, HDL, LDLCALC, TRIG    Wt Readings from Last  3 Encounters:  05/24/19 196 lb 8 oz (89.1 kg)  08/16/17 190 lb (86.2 kg)  02/17/17 185 lb (83.9 kg)     ASSESSMENT AND PLAN:  Problem List Items Addressed This Visit    None    Visit Diagnoses    Hyperthyroidism    -  Primary   Relevant Medications   methimazole (TAPAZOLE) 10 MG tablet   propranolol ER (INDERAL LA) 60 MG 24 hr capsule   Diabetes mellitus type 2, insulin dependent (HCC)       Mixed hyperlipidemia       Relevant Medications   tadalafil (CIALIS) 20 MG tablet   propranolol ER (INDERAL LA) 60 MG 24 hr capsule   Aortic valve stenosis, etiology of cardiac valve disease unspecified       Relevant Medications   tadalafil (CIALIS) 20 MG tablet   propranolol ER (INDERAL LA) 60 MG 24 hr capsule   Other Relevant Orders  EKG 12-Lead   Essential hypertension       Relevant Medications   tadalafil (CIALIS) 20 MG tablet   propranolol ER (INDERAL LA) 60 MG 24 hr capsule   Paroxysmal tachycardia (HCC)       Relevant Medications   tadalafil (CIALIS) 20 MG tablet   propranolol ER (INDERAL LA) 60 MG 24 hr capsule   Other Relevant Orders   EKG 12-Lead     Hyperthyroidism Lab work reviewed with him performed by endocrine Appears endocrine has started methimazole 10 mg daily He has been on this for the past week with mild improvement in his symptoms, still with some tremor and tachycardia rate still in the 90s -Recommend he start propranolol extended release 60 mg daily --Etiology of his hypothyroidism unclear, recommend he follow-up with endocrine May need thyroid ultrasound, follow-up lab work Unclear if this was exacerbated by recent anesthesia for Dupuytren's contracture or from his Covid vaccine  Essential hypertension Decrease losartan down to 25 mg daily, propranolol as above  Tachycardia Sinus tachycardia, secondary to hyperthyroidism as detailed above  Moderate to severe aortic valve stenosis Marked progression in his aortic valve stenosis over the past 2  years Likely having more symptoms in the setting of his hyperthyroidism and tachycardia -We recommended repeat echocardiogram on annual basis -We did discuss various treatment options for his aortic valve stenosis including surgical intervention and noninvasive options such as TAVR  Hyperlipidemia Numbers well controlled, CT scan chest abdomen pelvis reviewed with minimal coronary calcification, minimal aortic atherosclerosis  Diabetes type 2 on insulin Hemoglobin A1c well controlled, down to 6  Lower extremity edema Likely secondary to recent tachycardia in the setting of aortic valve stenosis We will prescribe Lasix 20 mg to take as needed for worsening leg swelling He prefers no potassium pill, he eats lots of bananas  Disposition:   F/U  3 months  Total encounter time more than 60 minutes Greater than 50% was spent in counseling and coordination of care with the patient  Signed, Esmond Plants, M.D., Ph.D. [place], [place]

## 2019-05-24 NOTE — Addendum Note (Signed)
Addended by: Minna Merritts on: 05/24/2019 01:36 PM   Modules accepted: Orders

## 2019-05-24 NOTE — Patient Instructions (Addendum)
Medication Instructions:  Your physician has recommended you make the following change in your medication:   1) Decrease losartan 50 mg- take 1/2 tablet (25 mg) by mouth once daily in the morning  2) Start propranolol ER 60 mg- take 1 capsule (60 mg) by mouth once daily in the evening  If you need a refill on your cardiac medications before your next appointment, please call your pharmacy.    Lab work: No new labs needed   If you have labs (blood work) drawn today and your tests are completely normal, you will receive your results only by: Marland Kitchen MyChart Message (if you have MyChart) OR . A paper copy in the mail If you have any lab test that is abnormal or we need to change your treatment, we will call you to review the results.   Testing/Procedures: No new testing needed   Follow-Up: At [facility], you and your health needs are our priority.  As part of our continuing mission to provide you with exceptional heart care, we have created designated Provider Care Teams.  These Care Teams include your primary Cardiologist (physician) and Advanced Practice Providers (APPs -  Physician Assistants and Nurse Practitioners) who all work together to provide you with the care you need, when you need it.  . You will need a follow up appointment in 3 months  . Providers on your designated Care Team:   . Murray Hodgkins, NP . Christell Faith, PA-C . Marrianne Mood, PA-C  Any Other Special Instructions Will Be Listed Below (If Applicable).  For educational health videos Log in to : [email or web address] Or : [email or web address], password : [email or web address]

## 2019-05-29 ENCOUNTER — Ambulatory Visit: Payer: Medicare Other | Admitting: Cardiovascular Disease

## 2019-05-29 ENCOUNTER — Telehealth: Payer: Self-pay | Admitting: Cardiovascular Disease

## 2019-05-29 DIAGNOSIS — E0590 Thyrotoxicosis, unspecified without thyrotoxic crisis or storm: Secondary | ICD-10-CM

## 2019-05-29 NOTE — Telephone Encounter (Signed)
Spoke with patient and he did want to move forward with getting ultrasound of his thyroid. Order placed and was going to provide him with number to call for scheduling but [place] to write on. Advised I could call his daughter and he was agreeable. Called and spoke with daughter Leafy Ro and provided her with number to schedule at [phone]. She verbalized understanding with no further questions at this time.

## 2019-06-05 ENCOUNTER — Ambulatory Visit: Payer: Medicare Other | Admitting: Cardiovascular Disease

## 2019-06-13 ENCOUNTER — Other Ambulatory Visit: Payer: Self-pay

## 2019-06-13 ENCOUNTER — Ambulatory Visit
Admission: RE | Admit: 2019-06-13 | Discharge: 2019-06-13 | Disposition: A | Discharge status: Home / Self Care | Payer: Medicare Other | Source: Ambulatory Visit | Attending: Cardiovascular Disease | Admitting: Cardiovascular Disease

## 2019-06-13 DIAGNOSIS — E0590 Thyrotoxicosis, unspecified without thyrotoxic crisis or storm: Secondary | ICD-10-CM | POA: Diagnosis present

## 2019-08-11 ENCOUNTER — Other Ambulatory Visit: Payer: Self-pay | Admitting: Cardiovascular Disease

## 2019-08-21 NOTE — Progress Notes (Signed)
Cardiology Office Note  Date:  08/22/2019   ID:  Frank Mckee, Frank Mckee [birth year], MRN [id number]  PCP:  Sofie Hartigan, MD   Chief Complaint  Patient presents with   other    3 month follow up. Meds reviewed by the pt. verbally. "doing well."     HPI:  Mr. Rhyan Radler is a 69 year old gentleman with past medical history of Diabetes type 1, on insulin pump Hyperlipidemia Hypertension Thyroid disease/Graves' Who presents by referral from primary care for tachycardia, shortness of breath, hyper thyroidism,  severe aortic valve stenosis  Last seen in clinic April 2021 by myself At that time had tachycardia, shortness of breath, TSH less than 0.01, weight loss  Echocardiogram April 2021 normal LV function, grade 2 diastolic dysfunction, moderate to severe aortic valve stenosis mean gradient 36 mmHg  He was started on extended release propranolol 60 mg daily, had follow-up with endocrine, Methimazole had been started 10 mg daily Losartan was decreased down to 25 daily Lasix as needed for leg swelling  In follow-up, propranolol held secondary to improved heart rate A1c 6.1 Back on losartan 50  Notes from endocrine reviewed personally by myself TSH was rechecked on visit with endocrine July 31, 2019 TSH 17.59 Graves' disease based on antibody testing On methimazole 3 days then down to 5 mg daily  Weight has stabilized [place] He does notice more shortness of breath seems to give out more  Long discussion concerning echocardiogram dated April 2021 showing  severe aortic valve stenosis mean gradient 36 mmHg, velocity close to 4 m/s  EKG personally reviewed by myself on todays visit Normal sinus rhythm no significant ST-T wave changes  Past medical history reviewed CT scan November 2016 chest abdomen pelvis with no significant coronary calcification, minimal abdominal aortic calcification  PMH:   has a past  medical history of Diabetes mellitus without complication ([place]), Heart murmur, Hypercholesteremia, and Hypertension.  PSH:    Past Surgical History:  Procedure Laterality Date   arm surgery     BACK SURGERY     COLONOSCOPY WITH PROPOFOL N/A 02/17/2017   Procedure: COLONOSCOPY WITH PROPOFOL;  Surgeon: Toledo, Benay Pike, MD;  Location: [facility] ENDOSCOPY;  Service: Gastroenterology;  Laterality: N/A;   EYE SURGERY     cataract extraction   SHOULDER SURGERY      Current Outpatient Medications  Medication Sig Dispense Refill   aspirin EC 81 MG tablet Take 81 mg by mouth daily.     ezetimibe (ZETIA) 10 MG tablet Take 10 mg by mouth daily.     fexofenadine (ALLEGRA) 180 MG tablet Take 180 mg by mouth daily.     furosemide (LASIX) 20 MG tablet Take 1 tablet (20 mg total) by mouth daily as needed. 90 tablet 3   insulin aspart (NOVOLOG) 100 UNIT/ML injection Inject into the skin 3 (three) times daily before meals.     losartan (COZAAR) 50 MG tablet Take 1 tablet (50 mg total) by mouth daily. 90 tablet 3   meloxicam (MOBIC) 15 MG tablet Take 1 tablet (15 mg total) by mouth daily. 30 tablet 0   methimazole (TAPAZOLE) 10 MG tablet Take 5 mg by mouth daily.      Multiple Vitamin (MULTIVITAMIN) tablet Take 1 tablet by mouth daily.     naftifine (NAFTIN) 1 % cream Apply topically.     propranolol ER (INDERAL LA) 60 MG 24 hr capsule TAKE 1 CAPSULE(60 MG)  BY MOUTH DAILY 90 capsule 0   tadalafil (CIALIS) 20 MG tablet Take by mouth.     tamsulosin (FLOMAX) 0.4 MG CAPS capsule Take 0.4 mg by mouth daily.     triamcinolone cream (KENALOG) 0.1 % Apply topically.     No current facility-administered medications for this visit.     Allergies:   Atorvastatin   Social History:  The patient  reports that he has never smoked. He has never used smokeless tobacco. He reports that he does not drink alcohol and does not use drugs.   Family History:   family history includes Diabetes in his  father; Healthy in his mother; Hypertension in his father.    Review of Systems: Review of Systems  Constitutional: Negative.   HENT: Negative.   Respiratory: Positive for shortness of breath.   Cardiovascular: Negative.   Gastrointestinal: Negative.   Musculoskeletal: Negative.   Neurological: Negative.   Psychiatric/Behavioral: Negative.   All other systems reviewed and are negative.   PHYSICAL EXAM: VS:  BP (!) 152/64 (BP Location: Left Arm, Patient Position: Sitting, Cuff Size: Normal)    Pulse 72    Ht 5\' 9"  (1.753 m)    Wt 206 lb 6 oz (93.6 kg)    SpO2 97%    BMI 30.48 kg/m  , BMI Body mass index is 30.48 kg/m. Constitutional:  oriented to person, place, and time. No distress.  HENT:  Head: Normocephalic and atraumatic.  Eyes:  no discharge. No scleral icterus.  Neck: Normal range of motion. Neck supple. No JVD present.  Cardiovascular: Normal rate, regular rhythm, normal heart sounds and intact distal pulses. Exam reveals no gallop and no friction rub. No edema 3/6 systolic ejection murmur heard right sternal border. Pulmonary/Chest: Effort normal and breath sounds normal. No stridor. No respiratory distress.  no wheezes.  no rales.  no tenderness.  Abdominal: Soft.  no distension.  no tenderness.  Musculoskeletal: Normal range of motion.  no  tenderness or deformity.  Neurological:  normal muscle tone. Coordination normal. No atrophy Skin: Skin is warm and dry. No rash noted. not diaphoretic.  Psychiatric:  normal mood and affect. behavior is normal. Thought content normal.       Recent Labs: No results found for requested labs within last 8760 hours.    Lipid Panel No results found for: CHOL, HDL, LDLCALC, TRIG    Wt Readings from Last 3 Encounters:  08/22/19 206 lb 6 oz (93.6 kg)  05/24/19 196 lb 8 oz (89.1 kg)  08/16/17 190 lb (86.2 kg)     ASSESSMENT AND PLAN:  Problem List Items Addressed This Visit    None    Visit Diagnoses    Paroxysmal  tachycardia ([place])    -  Primary   Relevant Medications   losartan (COZAAR) 50 MG tablet   Other Relevant Orders   EKG 12-Lead   ECHOCARDIOGRAM COMPLETE   Diabetes mellitus type 2, insulin dependent (HCC)       Relevant Medications   losartan (COZAAR) 50 MG tablet   Other Relevant Orders   EKG 12-Lead   ECHOCARDIOGRAM COMPLETE   Aortic valve stenosis, etiology of cardiac valve disease unspecified       Relevant Medications   losartan (COZAAR) 50 MG tablet   Other Relevant Orders   ECHOCARDIOGRAM COMPLETE   Mixed hyperlipidemia       Relevant Medications   losartan (COZAAR) 50 MG tablet   Other Relevant Orders   ECHOCARDIOGRAM COMPLETE   Essential  hypertension       Relevant Medications   losartan (COZAAR) 50 MG tablet   Other Relevant Orders   ECHOCARDIOGRAM COMPLETE   Hyperthyroidism       Relevant Orders   ECHOCARDIOGRAM COMPLETE     Hyperthyroidism Suspected to be Graves' disease, symptoms moderately improved Methimazole dose decreased, he is off propranolol  Essential hypertension Losartan 50, he is off propranolol  Tachycardia On methimazole symptoms much improved Off beta-blocker   severe aortic valve stenosis Marked progression in his aortic valve stenosis over the past 2 years Long discussion with him, recommended repeat echocardiogram later this year given some shortness of breath symptoms In general he is very active though He would like to slowly proceed with work-up including possible catheterization late this year, meeting the valve surgical team if indicated late this year or early next year Discussed sternotomy, minimally invasive surgery, TAVR He would be open to learning about each modality Some time spent discussing each modality on today's visit  Hyperlipidemia Numbers well controlled, CT scan  minimal coronary calcification, minimal aortic atherosclerosis Continue current medications  Diabetes type 2 on insulin Hemoglobin A1c well  controlled, down to 6 Weight stable, very active at baseline  Lower extremity edema Did not need Lasix, no edema on today's visit   Total encounter time more than 25 minutes  Greater than 50% was spent in counseling and coordination of care with the patient   Signed, Esmond Plants, M.D., Ph.D. [place], [place]

## 2019-08-22 ENCOUNTER — Ambulatory Visit (INDEPENDENT_AMBULATORY_CARE_PROVIDER_SITE_OTHER): Payer: Medicare Other | Admitting: Cardiovascular Disease

## 2019-08-22 ENCOUNTER — Other Ambulatory Visit: Payer: Self-pay

## 2019-08-22 ENCOUNTER — Encounter: Payer: Self-pay | Admitting: Cardiovascular Disease

## 2019-08-22 VITALS — BP 152/64 | HR 72 | Ht 69.0 in | Wt 206.4 lb

## 2019-08-22 DIAGNOSIS — I350 Nonrheumatic aortic (valve) stenosis: Secondary | ICD-10-CM | POA: Diagnosis not present

## 2019-08-22 DIAGNOSIS — E782 Mixed hyperlipidemia: Secondary | ICD-10-CM | POA: Diagnosis not present

## 2019-08-22 DIAGNOSIS — I10 Essential (primary) hypertension: Secondary | ICD-10-CM

## 2019-08-22 DIAGNOSIS — I479 Paroxysmal tachycardia, unspecified: Secondary | ICD-10-CM

## 2019-08-22 DIAGNOSIS — E119 Type 2 diabetes mellitus without complications: Secondary | ICD-10-CM | POA: Diagnosis not present

## 2019-08-22 DIAGNOSIS — E0590 Thyrotoxicosis, unspecified without thyrotoxic crisis or storm: Secondary | ICD-10-CM

## 2019-08-22 DIAGNOSIS — Z794 Long term (current) use of insulin: Secondary | ICD-10-CM

## 2019-08-22 MED ORDER — LOSARTAN POTASSIUM 50 MG PO TABS: 50.0000 mg | ORAL_TABLET | Freq: Every day | ORAL | 3 refills | Status: DC

## 2019-08-22 NOTE — Patient Instructions (Addendum)
  Medication Instructions:  Increase the losartan back to 50 mg daily  If you need a refill on your cardiac medications before your next appointment, please call your pharmacy.    Lab work: No new labs needed   If you have labs (blood work) drawn today and your tests are completely normal, you will receive your results only by: Marland Kitchen MyChart Message (if you have MyChart) OR . A paper copy in the mail If you have any lab test that is abnormal or we need to change your treatment, we will call you to review the results.   Testing/Procedures: Echo after oct 2021 for aortic valve stenosis  Your physician has requested that you have an echocardiogram. Echocardiography is a painless test that uses sound waves to create images of your heart. It provides your doctor with information about the size and shape of your heart and how well your heart's chambers and valves are working. This procedure takes approximately one hour. There are no restrictions for this procedure.     Follow-Up: At [facility], you and your health needs are our priority.  As part of our continuing mission to provide you with exceptional heart care, we have created designated Provider Care Teams.  These Care Teams include your primary Cardiologist (physician) and Advanced Practice Providers (APPs -  Physician Assistants and Nurse Practitioners) who all work together to provide you with the care you need, when you need it.  . You will need a follow up appointment in 6 months   . Providers on your designated Care Team:   . Murray Hodgkins, NP . Christell Faith, PA-C . Marrianne Mood, PA-C  Any Other Special Instructions Will Be Listed Below (If Applicable).  COVID-19 Vaccine Information can be found at: ShippingScam.co.uk For questions related to vaccine distribution or appointments, please email [email or web address] or call [phone].

## 2019-10-17 ENCOUNTER — Other Ambulatory Visit: Payer: Self-pay

## 2019-10-17 ENCOUNTER — Emergency Department
Admission: EM | Admit: 2019-10-17 | Discharge: 2019-10-18 | Disposition: A | Discharge status: Home / Self Care | Payer: Medicare Other | Attending: Student in an Organized Health Care Education/Training Program | Admitting: Student in an Organized Health Care Education/Training Program

## 2019-10-17 DIAGNOSIS — T63441A Toxic effect of venom of bees, accidental (unintentional), initial encounter: Secondary | ICD-10-CM

## 2019-10-17 DIAGNOSIS — I10 Essential (primary) hypertension: Secondary | ICD-10-CM | POA: Diagnosis not present

## 2019-10-17 DIAGNOSIS — T782XXA Anaphylactic shock, unspecified, initial encounter: Secondary | ICD-10-CM

## 2019-10-17 DIAGNOSIS — Z794 Long term (current) use of insulin: Secondary | ICD-10-CM | POA: Insufficient documentation

## 2019-10-17 DIAGNOSIS — E119 Type 2 diabetes mellitus without complications: Secondary | ICD-10-CM | POA: Insufficient documentation

## 2019-10-17 DIAGNOSIS — Z79899 Other long term (current) drug therapy: Secondary | ICD-10-CM | POA: Insufficient documentation

## 2019-10-17 DIAGNOSIS — Z7982 Long term (current) use of aspirin: Secondary | ICD-10-CM | POA: Insufficient documentation

## 2019-10-17 LAB — BASIC METABOLIC PANEL
Anion gap: 12 (ref 5–15)
BUN: 24 mg/dL — ABNORMAL HIGH (ref 8–23)
CO2: 22 mmol/L (ref 22–32)
Calcium: 8.8 mg/dL — ABNORMAL LOW (ref 8.9–10.3)
Chloride: 105 mmol/L (ref 98–111)
Creatinine, Ser: 1.12 mg/dL (ref 0.61–1.24)
GFR calc Af Amer: >60 mL/min (ref >60)
GFR calc non Af Amer: >60 mL/min (ref >60)
Glucose, Bld: 113 mg/dL — ABNORMAL HIGH (ref 70–99)
Potassium: 3.4 mmol/L — ABNORMAL LOW (ref 3.5–5.1)
Sodium: 139 mmol/L (ref 135–145)

## 2019-10-17 LAB — CBC WITH DIFFERENTIAL/PLATELET
Abs Immature Granulocytes: 0.02 10*3/uL (ref 0.00–0.07)
Basophils Absolute: 0 10*3/uL (ref 0.0–0.1)
Basophils Relative: 0 %
Eosinophils Absolute: 0.1 10*3/uL (ref 0.0–0.5)
Eosinophils Relative: 2 %
HCT: 36 % — ABNORMAL LOW (ref 39.0–52.0)
Hemoglobin: 12.1 g/dL — ABNORMAL LOW (ref 13.0–17.0)
Immature Granulocytes: 0 %
Lymphocytes Relative: 28 %
Lymphs Abs: 1.7 10*3/uL (ref 0.7–4.0)
MCH: 30.4 pg (ref 26.0–34.0)
MCHC: 33.6 g/dL (ref 30.0–36.0)
MCV: 90.5 fL (ref 80.0–100.0)
Monocytes Absolute: 0.8 10*3/uL (ref 0.1–1.0)
Monocytes Relative: 12 %
Neutro Abs: 3.6 10*3/uL (ref 1.7–7.7)
Neutrophils Relative %: 58 %
Platelets: 238 10*3/uL (ref 150–400)
RBC: 3.98 MIL/uL — ABNORMAL LOW (ref 4.22–5.81)
RDW: 11.9 % (ref 11.5–15.5)
WBC: 6.1 10*3/uL (ref 4.0–10.5)
nRBC: 0 % (ref 0.0–0.2)

## 2019-10-17 MED ORDER — FAMOTIDINE IN NACL 20-0.9 MG/50ML-% IV SOLN
20.0000 mg | Freq: Once | INTRAVENOUS | Status: AC
  Administered 2019-10-17: 20 mg via INTRAVENOUS
  Filled 2019-10-17: qty 50

## 2019-10-17 MED ORDER — EPINEPHRINE 0.3 MG/0.3ML IJ SOAJ: 0.3000 mg | INTRAMUSCULAR | 1 refills | Status: AC | PRN

## 2019-10-17 MED ORDER — ACETAMINOPHEN 500 MG PO TABS
1000.0000 mg | ORAL_TABLET | Freq: Once | ORAL | Status: AC
  Administered 2019-10-17: 1000 mg via ORAL
  Filled 2019-10-17: qty 2

## 2019-10-17 MED ORDER — PREDNISONE 20 MG PO TABS: 40.0000 mg | ORAL_TABLET | Freq: Every day | ORAL | 0 refills | Status: AC

## 2019-10-17 NOTE — ED Triage Notes (Signed)
Pt arrived from home via [facility] following being stung by 20+ bees. Pt has hx of allergic reaction to stings but has never been stung this much. Pt took 100mg  benadryl prior to EMS. EMS administered 1.3 Epi IM, 125 Solumedrol, 578ml bolus NS. Pt has hx of diabetes and blood sugar dropped to 70 and pt received 175cc of B10, pt's last BG was 90 and pt turned off his insulin.

## 2019-10-17 NOTE — ED Provider Notes (Signed)
[facility] Emergency Department Provider Note    First MD Initiated Contact with Patient 10/17/19 1639     (approximate)  I have reviewed the triage vital signs and the nursing notes.   HISTORY  Chief Complaint Allergic Reaction    HPI Frank Mckee is a 69 y.o. male the below listed past medical history presents to the ER after being stung by multiple yellow jackets. Patient was driving his tractor and disrupted a nest. States he was stung innumerable times think it was probably 20+ before he was able to escape. He did have to dismount the tractor run into the field before he was able to get away. He has a history of allergic reaction and swelling related to bee stings therefore he told his friend to call EMS. When EMS found him he was given epinephrine Solu-Medrol as well as IV Benadryl. Was brought to the ER. He denied any shortness of breath other than from running through the field. Denies any tongue swelling. Primary location of the bee stings was on the right side of his face and ear.    Past Medical History:  Diagnosis Date  . Diabetes mellitus without complication ([place])   . Heart murmur   . Hypercholesteremia   . Hypertension    Family History  Problem Relation Age of Onset  . Healthy Mother   . Diabetes Father   . Hypertension Father    Past Surgical History:  Procedure Laterality Date  . arm surgery    . BACK SURGERY    . COLONOSCOPY WITH PROPOFOL N/A 02/17/2017   Procedure: COLONOSCOPY WITH PROPOFOL;  Surgeon: Toledo, Benay Pike, MD;  Location: [facility] ENDOSCOPY;  Service: Gastroenterology;  Laterality: N/A;  . EYE SURGERY     cataract extraction  . SHOULDER SURGERY     There are no problems to display for this patient.     Prior to Admission medications   Medication Sig Start Date End Date Taking? Authorizing Provider  aspirin EC 81 MG tablet Take 81 mg by mouth daily.   Yes [provider]  diphenhydrAMINE (BENADRYL) 25  MG tablet Take 25 mg by mouth every 6 (six) hours as needed.   Yes [provider]  fexofenadine (ALLEGRA) 180 MG tablet Take 180 mg by mouth daily.   Yes [provider]  ibuprofen (ADVIL) 200 MG tablet Take 200 mg by mouth every 6 (six) hours as needed.   Yes [provider]  insulin aspart (NOVOLOG) 100 UNIT/ML injection INJECT UP TO 60 UNITS UNDER THE SKIN VIA PUMP AS DIRECTED   Yes [provider]  losartan (COZAAR) 50 MG tablet Take 1 tablet (50 mg total) by mouth daily. 08/22/19  Yes Gollan, Kathlene November, MD  methimazole (TAPAZOLE) 10 MG tablet Take 10 mg by mouth daily.  05/17/19  Yes [provider]  Multiple Vitamin (MULTIVITAMIN) tablet Take 1 tablet by mouth daily.   Yes [provider]  tamsulosin (FLOMAX) 0.4 MG CAPS capsule Take 0.4 mg by mouth daily. 02/18/19  Yes [provider]  triamcinolone cream (KENALOG) 0.1 % 1 Application Topical 2-3 Times Daily 09/14/16  Yes [provider]  EPINEPHrine (EPIPEN 2-PAK) 0.3 mg/0.3 mL IJ SOAJ injection Inject 0.3 mg into the muscle as needed for anaphylaxis. 10/17/19   Merlyn Lot, MD  predniSONE (DELTASONE) 20 MG tablet Take 2 tablets (40 mg total) by mouth daily for 5 days. 10/17/19 10/22/19  Merlyn Lot, MD    Allergies Atorvastatin  Social History Social History   Tobacco Use  . Smoking status: Never Smoker  . Smokeless tobacco: Never Used  Vaping Use  . Vaping Use: Never used  Substance Use Topics  . Alcohol use: No  . Drug use: No    Review of Systems Patient denies headaches, rhinorrhea, blurry vision, numbness, shortness of breath, chest pain, edema, cough, abdominal pain, nausea, vomiting, diarrhea, dysuria, fevers, rashes or hallucinations unless otherwise stated above in HPI. ____________________________________________   PHYSICAL EXAM:  VITAL SIGNS: Vitals:   10/17/19 1635  BP: (!) 149/65  Pulse: (!) 102  Resp: (!) 21  Temp: 98.1  F (36.7 C)  SpO2: 92%    Constitutional: Alert and oriented.  Eyes: Conjunctivae are normal.  Head: Atraumatic. Urticarial erruption predominantly involved right side of face and head Nose: No congestion/rhinnorhea. Mouth/Throat: Mucous membranes are moist.   Neck: No stridor. Painless ROM.  Cardiovascular: Normal rate, regular rhythm. Grossly normal heart sounds.  Good peripheral circulation. Respiratory: Normal respiratory effort.  No retractions. Lungs CTAB. Gastrointestinal: Soft and nontender. No distention. No abdominal bruits. No CVA tenderness. Genitourinary:  Musculoskeletal: No lower extremity tenderness nor edema.  No joint effusions. Neurologic:  Normal speech and language. No gross focal neurologic deficits are appreciated. No facial droop Skin:  Skin is warm, dry and intact. Multiple urticarial eruptions scatter on face, neck, BUE, thorax c/w stings Psychiatric: Mood and affect are normal. Speech and behavior are normal.  ____________________________________________   LABS (all labs ordered are listed, but only abnormal results are displayed)  Results for orders placed or performed during the hospital encounter of 10/17/19 (from the past 24 hour(s))  CBC with Differential/Platelet     Status: Abnormal   Collection Time: 10/17/19  4:34 PM  Result Value Ref Range   WBC 6.1 4.0 - 10.5 K/uL   RBC 3.98 (L) 4.22 - 5.81 MIL/uL   Hemoglobin 12.1 (L) 13.0 - 17.0 g/dL   HCT 36.0 (L) 39 - 52 %   MCV 90.5 80.0 - 100.0 fL   MCH 30.4 26.0 - 34.0 pg   MCHC 33.6 30.0 - 36.0 g/dL   RDW 11.9 11.5 - 15.5 %   Platelets 238 150 - 400 K/uL   nRBC 0.0 0.0 - 0.2 %   Neutrophils Relative % 58 %   Neutro Abs 3.6 1.7 - 7.7 K/uL   Lymphocytes Relative 28 %   Lymphs Abs 1.7 0.7 - 4.0 K/uL   Monocytes Relative 12 %   Monocytes Absolute 0.8 0 - 1 K/uL   Eosinophils Relative 2 %   Eosinophils Absolute 0.1 0 - 0 K/uL   Basophils Relative 0 %   Basophils Absolute 0.0 0 - 0 K/uL    Immature Granulocytes 0 %   Abs Immature Granulocytes 0.02 0.00 - 0.07 K/uL  Basic metabolic panel     Status: Abnormal   Collection Time: 10/17/19  4:34 PM  Result Value Ref Range   Sodium 139 135 - 145 mmol/L   Potassium 3.4 (L) 3.5 - 5.1 mmol/L   Chloride 105 98 - 111 mmol/L   CO2 22 22 - 32 mmol/L   Glucose, Bld 113 (H) 70 - 99 mg/dL   BUN 24 (H) 8 - 23 mg/dL   Creatinine, Ser 1.12 0.61 - 1.24 mg/dL   Calcium 8.8 (L) 8.9 - 10.3 mg/dL   GFR calc non Af Amer >60 >60 mL/min   GFR calc Af Amer >60 >60 mL/min   Anion gap 12 5 -  15   ____________________________________________  EKG My review and personal interpretation at Time: 16"37   Indication: bee sting  Rate: 100  Rhythm: sinus Axis: normal Other: normal intervals, no stemi, poor r wave progression ____________________________________________  RADIOLOGY   ____________________________________________   PROCEDURES  Procedure(s) performed:  Procedures    Critical Care performed: no ____________________________________________   INITIAL IMPRESSION / ASSESSMENT AND PLAN / ED COURSE  Pertinent labs & imaging results that were available during my care of the patient were reviewed by me and considered in my medical decision making (see chart for details).   DDX: anaphylaxis, anaphylactoid, electrolyte abn, cellulitis  Frank Mckee is a 69 y.o. who presents to the ED with symptoms as described above.  Patient states some improvement after receiving EpiPen and already received Solu-Medrol and received Benadryl prior to arrival.  Does have diffuse urticaria after multiple bee stings.  Does have a history of anaphylactic reaction to this.  Will observe.  Will check basic blood work to evaluated signs of significant toxicity.  The patient will be placed on continuous pulse oximetry and telemetry for monitoring.  Laboratory evaluation will be sent to evaluate for the above complaints.     Clinical Course as of Oct 17 2030    Tue Oct 17, 2019  1817 Patient reassessed.  Remains nontoxic.  Urticarial eruptions are improving.  Feels like his pain is also improving.  I consulted with poison control regarding recommendations given the number of stings.  Agree with plan to observe for 4 to 5 hours to ensure no rebound but would otherwise be appropriate for standard care.   [PR]  2017 Patient again reassessed.  Urticaria has significantly improved.  Patient feels very well at this point.   [PR]    Clinical Course User Index [PR] Merlyn Lot, MD    The patient was evaluated in Emergency Department today for the symptoms described in the history of present illness. He/she was evaluated in the context of the global COVID-19 pandemic, which necessitated consideration that the patient might be at risk for infection with the SARS-CoV-2 virus that causes COVID-19. Institutional protocols and algorithms that pertain to the evaluation of patients at risk for COVID-19 are in a state of rapid change based on information released by regulatory bodies including the CDC and federal and state organizations. These policies and algorithms were followed during the patient's care in the ED.  As part of my medical decision making, I reviewed the following data within the [place] notes reviewed and incorporated, Labs reviewed, notes from prior ED visits and [place] Controlled Substance Database   ____________________________________________   FINAL CLINICAL IMPRESSION(S) / ED DIAGNOSES  Final diagnoses:  Bee sting reaction, accidental or unintentional, initial encounter  Anaphylaxis, initial encounter      NEW MEDICATIONS STARTED DURING THIS VISIT:  New Prescriptions   EPINEPHRINE (EPIPEN 2-PAK) 0.3 MG/0.3 ML IJ SOAJ INJECTION    Inject 0.3 mg into the muscle as needed for anaphylaxis.   PREDNISONE (DELTASONE) 20 MG TABLET    Take 2 tablets (40 mg total) by mouth daily for 5 days.     Note:  This  document was prepared using Dragon voice recognition software and may include unintentional dictation errors.    Merlyn Lot, MD 10/17/19 2032

## 2019-10-18 NOTE — ED Notes (Signed)
Pt left ED 2050 on 9/14 in NAD. Pt ambulatory and taken home by significant other. Delay in charting to discharge patient from electronic record.

## 2019-10-26 ENCOUNTER — Other Ambulatory Visit: Payer: Self-pay | Admitting: Cardiovascular Disease

## 2019-11-15 ENCOUNTER — Other Ambulatory Visit: Payer: Self-pay | Admitting: Cardiovascular Disease

## 2019-11-15 DIAGNOSIS — I350 Nonrheumatic aortic (valve) stenosis: Secondary | ICD-10-CM

## 2019-11-15 DIAGNOSIS — I479 Paroxysmal tachycardia, unspecified: Secondary | ICD-10-CM

## 2019-11-22 ENCOUNTER — Other Ambulatory Visit: Payer: Self-pay

## 2019-11-22 ENCOUNTER — Ambulatory Visit (INDEPENDENT_AMBULATORY_CARE_PROVIDER_SITE_OTHER): Payer: Medicare Other

## 2019-11-22 DIAGNOSIS — I350 Nonrheumatic aortic (valve) stenosis: Secondary | ICD-10-CM

## 2019-11-22 DIAGNOSIS — I479 Paroxysmal tachycardia, unspecified: Secondary | ICD-10-CM

## 2019-11-22 LAB — ECHOCARDIOGRAM COMPLETE
AR max vel: 0.88 cm2
AV Area VTI: 0.96 cm2
AV Area mean vel: 0.85 cm2
AV Mean grad: 41.5 mmHg
AV Peak grad: 82.4 mmHg
Ao pk vel: 4.54 m/s
Area-P 1/2: 2.41 cm2
Calc EF: 56.1 %
S' Lateral: 3.3 cm
Single Plane A2C EF: 52.1 %
Single Plane A4C EF: 59.4 %

## 2019-11-22 MED ORDER — PERFLUTREN LIPID MICROSPHERE
1.0000 mL | INTRAVENOUS | Status: AC | PRN
  Administered 2019-11-22: 2 mL via INTRAVENOUS

## 2019-11-27 ENCOUNTER — Other Ambulatory Visit: Payer: Self-pay

## 2019-11-27 ENCOUNTER — Ambulatory Visit (INDEPENDENT_AMBULATORY_CARE_PROVIDER_SITE_OTHER): Payer: Medicare Other | Admitting: Cardiovascular Disease

## 2019-11-27 ENCOUNTER — Telehealth: Payer: Self-pay | Admitting: *Deleted

## 2019-11-27 ENCOUNTER — Encounter: Payer: Self-pay | Admitting: Cardiovascular Disease

## 2019-11-27 VITALS — BP 144/60 | HR 78 | Ht 69.0 in | Wt 195.4 lb

## 2019-11-27 DIAGNOSIS — I350 Nonrheumatic aortic (valve) stenosis: Secondary | ICD-10-CM | POA: Diagnosis not present

## 2019-11-27 NOTE — Telephone Encounter (Signed)
Spoke with patient and inquired if he would like to come in later today to review test results with provider. He verbalized understanding and confirmed time for later today. No further questions at this time.

## 2019-11-27 NOTE — Patient Instructions (Addendum)
Has appt 02/26/2020  Medication Instructions:  No changes  If you need a refill on your cardiac medications before your next appointment, please call your pharmacy.    Lab work: No new labs needed   If you have labs (blood work) drawn today and your tests are completely normal, you will receive your results only by: Marland Kitchen MyChart Message (if you have MyChart) OR . A paper copy in the mail If you have any lab test that is abnormal or we need to change your treatment, we will call you to review the results.   Testing/Procedures: No new testing needed   Follow-Up: At [facility], you and your health needs are our priority.  As part of our continuing mission to provide you with exceptional heart care, we have created designated Provider Care Teams.  These Care Teams include your primary Cardiologist (physician) and Advanced Practice Providers (APPs -  Physician Assistants and Nurse Practitioners) who all work together to provide you with the care you need, when you need it.  . You will need a follow up appointment in Jan 2022  . Providers on your designated Care Team:   . Murray Hodgkins, NP . Christell Faith, PA-C . Marrianne Mood, PA-C  Any Other Special Instructions Will Be Listed Below (If Applicable).  COVID-19 Vaccine Information can be found at: ShippingScam.co.uk For questions related to vaccine distribution or appointments, please email [email or web address] or call [phone].

## 2019-11-27 NOTE — Telephone Encounter (Signed)
-----   Message from Minna Merritts, MD sent at 11/26/2019 10:51 AM EDT ----- Echocardiogram Normal left ventricular function, normal right heart function Aortic valve is severe stenosis We probably need to start to the work-up for aortic valve replacement This would include cardiac catheterization then meeting in [facility] with valvular team Does he want to come in to set up  procedure?

## 2019-11-27 NOTE — Progress Notes (Signed)
Cardiology Office Note  Date:  11/27/2019   ID:  Frank Mckee, Frank Mckee Jun 30, 1950, MRN [id number]  PCP:  Sofie Hartigan, MD   Chief Complaint  Patient presents with  . Other    Follow up post ECHO. Meds review verbally with patient.     HPI:  [name] is a 69 year old gentleman with past medical history of Diabetes type 1, on insulin pump Hyperlipidemia Hypertension Thyroid disease/Graves' Who presents by referral from primary care for tachycardia, shortness of breath, hyper thyroidism,  severe aortic valve stenosis  In follow-up today reports his thyroid is much better Off his beta-blockers, only on losartan for blood pressure Denies significant shortness of breath, no chest discomfort on exertion Very active at baseline, remodeling houses, deer hunting etc. Does not feel the aortic valve is getting in his way at this time  Continues to follow with endocrinology for his thyroid Back in April 2021 , had tachycardia shortness of breath TSH less than 0.01, weight loss Now doing much better  Echocardiogram April 2021 normal LV function, grade 2 diastolic dysfunction, moderate to severe aortic valve stenosis mean gradient 36 mmHg  Repeat echocardiogram mean gradient up to 45 mmHg, gradient in the 90s estimated aortic valve area less than 1 cm  EKG personally reviewed by myself on todays visit Normal sinus rhythm rate 78 bpm no significant ST-T wave changes  Past medical history reviewed CT scan November 2016 chest abdomen pelvis with no significant coronary calcification, minimal abdominal aortic calcification  PMH:   has a past medical history of Diabetes mellitus without complication ([place]), Heart murmur, Hypercholesteremia, and Hypertension.  PSH:    Past Surgical History:  Procedure Laterality Date  . arm surgery    . BACK SURGERY    . COLONOSCOPY WITH PROPOFOL N/A 02/17/2017   Procedure: COLONOSCOPY WITH PROPOFOL;  Surgeon: Toledo, Benay Pike, MD;  Location:  [facility] ENDOSCOPY;  Service: Gastroenterology;  Laterality: N/A;  . EYE SURGERY     cataract extraction  . SHOULDER SURGERY      Current Outpatient Medications  Medication Sig Dispense Refill  . aspirin EC 81 MG tablet Take 81 mg by mouth daily.    . diphenhydrAMINE (BENADRYL) 25 MG tablet Take 25 mg by mouth every 6 (six) hours as needed.    Marland Kitchen EPINEPHrine (EPIPEN 2-PAK) 0.3 mg/0.3 mL IJ SOAJ injection Inject 0.3 mg into the muscle as needed for anaphylaxis. 1 each 1  . fexofenadine (ALLEGRA) 180 MG tablet Take 180 mg by mouth daily.    Marland Kitchen ibuprofen (ADVIL) 200 MG tablet Take 200 mg by mouth every 6 (six) hours as needed.    . insulin aspart (NOVOLOG) 100 UNIT/ML injection INJECT UP TO 60 UNITS UNDER THE SKIN VIA PUMP AS DIRECTED    . losartan (COZAAR) 50 MG tablet Take 1 tablet (50 mg total) by mouth daily. 90 tablet 3  . methimazole (TAPAZOLE) 10 MG tablet Take 10 mg by mouth daily.     . Multiple Vitamin (MULTIVITAMIN) tablet Take 1 tablet by mouth daily.    . predniSONE (DELTASONE) 10 MG tablet 40mg  po qd x 1 week, then 30mg  then 20mg  then 10mg  then stop (4 week taper)    . tamsulosin (FLOMAX) 0.4 MG CAPS capsule Take 0.4 mg by mouth daily.    Marland Kitchen triamcinolone cream (KENALOG) 0.1 % 1 Application Topical 2-3 Times Daily     No current facility-administered medications for this visit.     Allergies:   Atorvastatin  Social History:  The patient  reports that he has never smoked. He has never used smokeless tobacco. He reports that he does not drink alcohol and does not use drugs.   Family History:   family history includes Diabetes in his father; Healthy in his mother; Hypertension in his father.    Review of Systems: Review of Systems  Constitutional: Negative.   HENT: Negative.   Respiratory: Positive for shortness of breath.   Cardiovascular: Negative.   Gastrointestinal: Negative.   Musculoskeletal: Negative.   Neurological: Negative.   Psychiatric/Behavioral: Negative.    All other systems reviewed and are negative.   PHYSICAL EXAM: VS:  BP (!) 144/60 (BP Location: Left Arm, Patient Position: Sitting, Cuff Size: Normal)   Pulse 78   Ht 5\' 9"  (1.753 m)   Wt 195 lb 6 oz (88.6 kg)   SpO2 95%   BMI 28.85 kg/m  , BMI Body mass index is 28.85 kg/m. Constitutional:  oriented to person, place, and time. No distress.  HENT:  Head: Grossly normal Eyes:  no discharge. No scleral icterus.  Neck: No JVD, no carotid bruits  Cardiovascular: Regular rate and rhythm, 3/6 sem appreciated right sternal border Pulmonary/Chest: Clear to auscultation bilaterally, no wheezes or rails Abdominal: Soft.  no distension.  no tenderness.  Musculoskeletal: Normal range of motion Neurological:  normal muscle tone. Coordination normal. No atrophy Skin: Skin warm and dry Psychiatric: normal affect, pleasant  Recent Labs: 10/17/2019: BUN 24; Creatinine, Ser 1.12; Hemoglobin 12.1; Platelets 238; Potassium 3.4; Sodium 139    Lipid Panel No results found for: CHOL, HDL, LDLCALC, TRIG    Wt Readings from Last 3 Encounters:  11/27/19 195 lb 6 oz (88.6 kg)  10/17/19 189 lb (85.7 kg)  08/22/19 206 lb 6 oz (93.6 kg)     ASSESSMENT AND PLAN:  Problem List Items Addressed This Visit    None    Visit Diagnoses    Aortic valve stenosis, etiology of cardiac valve disease unspecified    -  Primary   Relevant Orders   EKG 12-Lead     Hyperthyroidism Suspected to be Graves' disease,  Previously on beta-blockers, he is weaned himself off and reports he feels well Followed by endocrinology  Essential hypertension Losartan 50,  Blood pressure stable Recommend he continue to monitor this at home  Tachycardia On methimazole daily, off propranolol   severe aortic valve stenosis Significant progression over the past year and a half Denies having symptoms but is starting to hear noises at nighttime consistent with worsening valve disease Long discussion concerning various  treatment options, he would if at all possible like to have everything done January 2022 as he is asymptomatic He has an appointment set up for January 24, would discuss cardiac catheterization at that time for further triaging.  He is interested in minimally invasive aortic valve surgery  Hyperlipidemia Numbers well controlled, CT scan  minimal coronary calcification, minimal aortic atherosclerosis Denies angina  Diabetes type 2 on insulin Hemoglobin A1c well controlled, down to 6 Watching his diet, very active at baseline  Lower extremity edema Minimal trace edema   Total encounter time more than 25 minutes  Greater than 50% was spent in counseling and coordination of care with the patient   Signed, Esmond Plants, M.D., Ph.D. [place], [place]

## 2020-02-25 NOTE — Progress Notes (Signed)
Cardiology Office Note  Date:  02/26/2020   ID:  Frank Mckee, Frank Mckee 09-09-1950, MRN [id number]  PCP:  Sofie Hartigan, MD   Chief Complaint  Patient presents with  . OTher    6 month follow up. Meds reviewed verbally with patient.     HPI:  Frank Mckee is a 70 year old gentleman with past medical history of Diabetes type 1, on insulin pump Hyperlipidemia Hypertension Thyroid disease/Graves' Who presents by referral from primary care for tachycardia, shortness of breath, hyper thyroidism,  severe aortic valve stenosis  In follow-up reports he feels well, has been hunting in the woods, " Never felt better" Very active, describes 1 story where he shot a bear 350 pounds Had to go over the mountain and [place] to get him, pull him out with a tractor  Also shot 2 very large deer Denies any restrictions with his breathing, chest tightness, no orthostasis type symptoms  Wonders if he needs to do anything with the valve as he feels so well Images pulled up in the office today, it is calcified, select images leaflets do seem to open better than there gradient would dictate  Reports thyroid has been well controlled, no significant tachycardia   Echocardiogram April 2021 normal LV function, grade 2 diastolic dysfunction, moderate to severe aortic valve stenosis mean gradient 36 mmHg  Repeat echocardiogram mean gradient up to 45 mmHg, gradient in the 90s estimated aortic valve area less than 1 cm Images pulled up and reviewed on today's visit  EKG personally reviewed by myself on todays visit Normal sinus rhythm rate 78 bpm no significant ST-T wave changes  Past medical history reviewed CT scan November 2016 chest abdomen pelvis with no significant coronary calcification, minimal abdominal aortic calcification  PMH:   has a past medical history of Diabetes mellitus without complication ([place]), Heart murmur, Hypercholesteremia, and Hypertension.  PSH:    Past Surgical  History:  Procedure Laterality Date  . arm surgery    . BACK SURGERY    . COLONOSCOPY WITH PROPOFOL N/A 02/17/2017   Procedure: COLONOSCOPY WITH PROPOFOL;  Surgeon: Toledo, Benay Pike, MD;  Location: [facility] ENDOSCOPY;  Service: Gastroenterology;  Laterality: N/A;  . EYE SURGERY     cataract extraction  . SHOULDER SURGERY      Current Outpatient Medications  Medication Sig Dispense Refill  . aspirin EC 81 MG tablet Take 81 mg by mouth daily.    . diphenhydrAMINE (BENADRYL) 25 MG tablet Take 25 mg by mouth every 6 (six) hours as needed.    Marland Kitchen EPINEPHrine (EPIPEN 2-PAK) 0.3 mg/0.3 mL IJ SOAJ injection Inject 0.3 mg into the muscle as needed for anaphylaxis. 1 each 1  . fexofenadine (ALLEGRA) 180 MG tablet Take 180 mg by mouth daily.    Marland Kitchen ibuprofen (ADVIL) 200 MG tablet Take 800 mg by mouth daily.    . insulin aspart (NOVOLOG) 100 UNIT/ML injection INJECT UP TO 60 UNITS UNDER THE SKIN VIA PUMP AS DIRECTED    . losartan (COZAAR) 50 MG tablet Take 1 tablet (50 mg total) by mouth daily. 90 tablet 3  . methimazole (TAPAZOLE) 10 MG tablet Take 10 mg by mouth daily.     . Multiple Vitamin (MULTIVITAMIN) tablet Take 1 tablet by mouth daily.    . tamsulosin (FLOMAX) 0.4 MG CAPS capsule Take 0.4 mg by mouth daily.    Marland Kitchen triamcinolone cream (KENALOG) 0.1 % 1 Application Topical 2-3 Times Daily    . CALCIUM-VITAMIN D PO Take  1 tablet by mouth daily at 12 noon.     No current facility-administered medications for this visit.     Allergies:   Atorvastatin   Social History:  The patient  reports that he has never smoked. He has never used smokeless tobacco. He reports that he does not drink alcohol and does not use drugs.   Family History:   family history includes Diabetes in his father; Healthy in his mother; Hypertension in his father.    Review of Systems: Review of Systems  Constitutional: Negative.   HENT: Negative.   Respiratory: Negative.   Cardiovascular: Negative.   Gastrointestinal:  Negative.   Musculoskeletal: Negative.   Neurological: Negative.   Psychiatric/Behavioral: Negative.   All other systems reviewed and are negative.   PHYSICAL EXAM: VS:  BP 130/60 (BP Location: Left Arm, Patient Position: Sitting, Cuff Size: Normal)   Pulse 61   Ht 5\' 9"  (1.753 m)   Wt 190 lb (86.2 kg)   SpO2 97%   BMI 28.06 kg/m  , BMI Body mass index is 28.06 kg/m. Constitutional:  oriented to person, place, and time. No distress.  HENT:  Head: Grossly normal Eyes:  no discharge. No scleral icterus.  Neck: No JVD, no carotid bruits  Cardiovascular: Regular rate and rhythm, 3/6 systolic ejection murmur appreciated right sternal border Pulmonary/Chest: Clear to auscultation bilaterally, no wheezes or rails Abdominal: Soft.  no distension.  no tenderness.  Musculoskeletal: Normal range of motion Neurological:  normal muscle tone. Coordination normal. No atrophy Skin: Skin warm and dry Psychiatric: normal affect, pleasant   Recent Labs: 10/17/2019: BUN 24; Creatinine, Ser 1.12; Hemoglobin 12.1; Platelets 238; Potassium 3.4; Sodium 139    Lipid Panel No results found for: CHOL, HDL, LDLCALC, TRIG    Wt Readings from Last 3 Encounters:  02/26/20 190 lb (86.2 kg)  11/27/19 195 lb 6 oz (88.6 kg)  10/17/19 189 lb (85.7 kg)     ASSESSMENT AND PLAN:  Problem List Items Addressed This Visit   None   Visit Diagnoses    Aortic valve stenosis, etiology of cardiac valve disease unspecified    -  Primary   Relevant Orders   EKG 41-YSAY   Basic metabolic panel   CBC   Paroxysmal tachycardia (HCC)       Diabetes mellitus type 2, insulin dependent (HCC)       Mixed hyperlipidemia       Essential hypertension       Preop examination       Relevant Orders   Basic metabolic panel   CBC     Hyperthyroidism Suspected to be Graves' disease,  Followed by endocrinology Denies tachycardia Weaned himself off beta-blockers  Essential hypertension Losartan 50,  Blood  pressure stable Recommend he continue to monitor this at home  Tachycardia On methimazole daily, off propranolol   severe aortic valve stenosis Per the echo there has been significant progression  By his symptoms, appears to have none Reactive as detailed above Given the lack of symptoms, and select images on recent transthoracic echo valve opening appear larger than estimated, we have suggested a transesophageal echo -He is in agreement, we will schedule this at his convenience in the next several weeks  Hyperlipidemia CT scan  minimal coronary calcification, minimal aortic atherosclerosis No changes made  Diabetes type 2 on insulin Hemoglobin A1c well controlled, down to 6 No changes to his regimen  Lower extremity edema Previously noted, trace No significant edema   Total encounter  time more than 25 minutes  Greater than 50% was spent in counseling and coordination of care with the patient   Signed, Esmond Plants, M.D., Ph.D. [place], [place]

## 2020-02-25 NOTE — H&P (View-Only) (Signed)
Cardiology Office Note  Date:  02/26/2020   ID:  Mckee, Frank 02-16-1950, MRN [id number]  PCP:  Sofie Hartigan, MD   Chief Complaint  Patient presents with  . OTher    6 month follow up. Meds reviewed verbally with patient.     HPI:  [name] is a 70 year old gentleman with past medical history of Diabetes type 1, on insulin pump Hyperlipidemia Hypertension Thyroid disease/Graves' Who presents by referral from primary care for tachycardia, shortness of breath, hyper thyroidism,  severe aortic valve stenosis  In follow-up reports he feels well, has been hunting in the woods, " Never felt better" Very active, describes 1 story where he shot a bear 350 pounds Had to go over the mountain and [place] to get him, pull him out with a tractor  Also shot 2 very large deer Denies any restrictions with his breathing, chest tightness, no orthostasis type symptoms  Wonders if he needs to do anything with the valve as he feels so well Images pulled up in the office today, it is calcified, select images leaflets do seem to open better than there gradient would dictate  Reports thyroid has been well controlled, no significant tachycardia   Echocardiogram April 2021 normal LV function, grade 2 diastolic dysfunction, moderate to severe aortic valve stenosis mean gradient 36 mmHg  Repeat echocardiogram mean gradient up to 45 mmHg, gradient in the 90s estimated aortic valve area less than 1 cm Images pulled up and reviewed on today's visit  EKG personally reviewed by myself on todays visit Normal sinus rhythm rate 78 bpm no significant ST-T wave changes  Past medical history reviewed CT scan November 2016 chest abdomen pelvis with no significant coronary calcification, minimal abdominal aortic calcification  PMH:   has a past medical history of Diabetes mellitus without complication (Frank Mckee), Heart murmur, Hypercholesteremia, and Hypertension.  PSH:    Past Surgical  History:  Procedure Laterality Date  . arm surgery    . BACK SURGERY    . COLONOSCOPY WITH PROPOFOL N/A 02/17/2017   Procedure: COLONOSCOPY WITH PROPOFOL;  Surgeon: Toledo, Benay Pike, MD;  Location: [facility] END[facility]COPY;  Service: Gastroenterology;  Laterality: N/A;  . EYE SURGERY     cataract extraction  . SHOULDER SURGERY      Current Outpatient Medications  Medication Sig Dispense Refill  . aspirin EC 81 MG tablet Take 81 mg by mouth daily.    . diphenhydrAMINE (BENADRYL) 25 MG tablet Take 25 mg by mouth every 6 (six) hours as needed.    Marland Kitchen EPINEPHrine (EPIPEN 2-PAK) 0.3 mg/0.3 mL IJ SOAJ injection Inject 0.3 mg into the muscle as needed for anaphylaxis. 1 each 1  . fexofenadine (ALLEGRA) 180 MG tablet Take 180 mg by mouth daily.    Marland Kitchen ibuprofen (ADVIL) 200 MG tablet Take 800 mg by mouth daily.    . insulin aspart (NOVOLOG) 100 UNIT/ML injection INJECT UP TO 60 UNITS UNDER THE SKIN VIA PUMP AS DIRECTED    . losartan (COZAAR) 50 MG tablet Take 1 tablet (50 mg total) by mouth daily. 90 tablet 3  . methimazole (TAPAZOLE) 10 MG tablet Take 10 mg by mouth daily.     . Multiple Vitamin (MULTIVITAMIN) tablet Take 1 tablet by mouth daily.    . tamsulosin (FLOMAX) 0.4 MG CAPS capsule Take 0.4 mg by mouth daily.    Marland Kitchen triamcinolone cream (KENALOG) 0.1 % 1 Application Topical 2-3 Times Daily    . CALCIUM-VITAMIN D PO Take  1 tablet by mouth daily at 12 noon.     No current facility-administered medications for this visit.     Allergies:   Atorvastatin   Social History:  The patient  reports that he has never smoked. He has never used smokeless tobacco. He reports that he does not drink alcohol and does not use drugs.   Family History:   family history includes Diabetes in his father; Healthy in his mother; Hypertension in his father.    Review of Systems: Review of Systems  Constitutional: Negative.   HENT: Negative.   Respiratory: Negative.   Cardiovascular: Negative.   Gastrointestinal:  Negative.   Musculoskeletal: Negative.   Neurological: Negative.   Psychiatric/Behavioral: Negative.   All other systems reviewed and are negative.   PHYSICAL EXAM: VS:  BP 130/60 (BP Location: Left Arm, Patient Position: Sitting, Cuff Size: Normal)   Pulse 61   Ht 5\' 9"  (1.753 m)   Wt 190 lb (86.2 kg)   SpO2 97%   BMI 28.06 kg/m  , BMI Body mass index is 28.06 kg/m. Constitutional:  oriented to person, place, and time. No distress.  HENT:  Head: Grossly normal Eyes:  no discharge. No scleral icterus.  Neck: No JVD, no carotid bruits  Cardiovascular: Regular rate and rhythm, 3/6 systolic ejection murmur appreciated right sternal border Pulmonary/Chest: Clear to auscultation bilaterally, no wheezes or rails Abdominal: Soft.  no distension.  no tenderness.  Musculoskeletal: Normal range of motion Neurological:  normal muscle tone. Coordination normal. No atrophy Skin: Skin warm and dry Psychiatric: normal affect, pleasant   Recent Labs: 10/17/2019: BUN 24; Creatinine, Ser 1.12; Hemoglobin 12.1; Platelets 238; Potassium 3.4; Sodium 139    Lipid Panel No results found for: CHOL, HDL, LDLCALC, TRIG    Wt Readings from Last 3 Encounters:  02/26/20 190 lb (86.2 kg)  11/27/19 195 lb 6 oz (88.6 kg)  10/17/19 189 lb (85.7 kg)     ASSESSMENT AND PLAN:  Problem List Items Addressed This Visit   None   Visit Diagnoses    Aortic valve stenosis, etiology of cardiac valve disease unspecified    -  Primary   Relevant Orders   EKG 41-YSAY   Basic metabolic panel   CBC   Paroxysmal tachycardia (HCC)       Diabetes mellitus type 2, insulin dependent (HCC)       Mixed hyperlipidemia       Essential hypertension       Preop examination       Relevant Orders   Basic metabolic panel   CBC     Hyperthyroidism Suspected to be Graves' disease,  Followed by endocrinology Denies tachycardia Weaned himself off beta-blockers  Essential hypertension Losartan 50,  Blood  pressure stable Recommend he continue to monitor this at home  Tachycardia On methimazole daily, off propranolol   severe aortic valve stenosis Per the echo there has been significant progression  By his symptoms, appears to have none Reactive as detailed above Given the lack of symptoms, and select images on recent transthoracic echo valve opening appear larger than estimated, we have suggested a transesophageal echo -He is in agreement, we will schedule this at his convenience in the next several weeks  Hyperlipidemia CT scan  minimal coronary calcification, minimal aortic atherosclerosis No changes made  Diabetes type 2 on insulin Hemoglobin A1c well controlled, down to 6 No changes to his regimen  Lower extremity edema Previously noted, trace No significant edema   Total encounter  time more than 25 minutes  Greater than 50% was spent in counseling and coordination of care with the patient   Signed, Esmond Plants, M.D., Ph.D. [place], [place]

## 2020-02-26 ENCOUNTER — Ambulatory Visit (INDEPENDENT_AMBULATORY_CARE_PROVIDER_SITE_OTHER): Payer: Medicare Other | Admitting: Cardiovascular Disease

## 2020-02-26 ENCOUNTER — Encounter: Payer: Self-pay | Admitting: Cardiovascular Disease

## 2020-02-26 ENCOUNTER — Other Ambulatory Visit: Payer: Self-pay

## 2020-02-26 ENCOUNTER — Telehealth: Payer: Self-pay | Admitting: Cardiovascular Disease

## 2020-02-26 VITALS — BP 130/60 | HR 61 | Ht 69.0 in | Wt 190.0 lb

## 2020-02-26 DIAGNOSIS — I479 Paroxysmal tachycardia, unspecified: Secondary | ICD-10-CM

## 2020-02-26 DIAGNOSIS — E119 Type 2 diabetes mellitus without complications: Secondary | ICD-10-CM | POA: Diagnosis not present

## 2020-02-26 DIAGNOSIS — Z794 Long term (current) use of insulin: Secondary | ICD-10-CM

## 2020-02-26 DIAGNOSIS — I350 Nonrheumatic aortic (valve) stenosis: Secondary | ICD-10-CM | POA: Diagnosis not present

## 2020-02-26 DIAGNOSIS — E782 Mixed hyperlipidemia: Secondary | ICD-10-CM | POA: Diagnosis not present

## 2020-02-26 DIAGNOSIS — Z01818 Encounter for other preprocedural examination: Secondary | ICD-10-CM

## 2020-02-26 DIAGNOSIS — I10 Essential (primary) hypertension: Secondary | ICD-10-CM

## 2020-02-26 NOTE — Telephone Encounter (Signed)
Patient's wife is calling, states she was just speaking with someone regarding patient's medications

## 2020-02-26 NOTE — Telephone Encounter (Signed)
Return pt call from where wife had reach out, pt reports someone from the hospital had called in regards to his medications, advised pt that this was not from our office at [facility] in [place], it could have been the pre-cert team following his care for his upcoming TEE this Friday. Pt verbalized understanding, will wait to see if they will call back.

## 2020-02-26 NOTE — Patient Instructions (Addendum)
Medication Instructions:  No changes   Lab work: CBC/BMP (preprocedure labs)   COVID PRE- TEST: You will need a COVID TEST prior to the procedure:  LOCATION: [facility].  DATE/TIME:  02/27/2020 8am-1pm   Testing/Procedures: TEE for severe aortic valve stenosis  You are scheduled for a TEE Cardioversion on 03/01/2020 with Dr. Rockey Situ.  Please arrive [facility] 07:00am. Your procedure is at 08:00am  DIET: Nothing to eat or drink after midnight except a sip of water with medications (see medication instructions below)  Medication Instructions: May take all medications with a small sip of water morning of procedure  You must have a responsible person to drive you home and stay in the waiting area during your procedure. Failure to do so could result in cancellation.  Bring your insurance cards.  *Special Note: Every effort is made to have your procedure done on time. Occasionally there are emergencies that occur at the hospital that may cause delays. Please be patient if a delay does occur.     Follow-Up: At [facility], you and your health needs are our priority.  As part of our continuing mission to provide you with exceptional heart care, we have created designated Provider Care Teams.  These Care Teams include your primary Cardiologist (physician) and Advanced Practice Providers (APPs -  Physician Assistants and Nurse Practitioners) who all work together to provide you with the care you need, when you need it.  . You will need a follow up appointment in 12 months  . Providers on your designated Care Team:   . Murray Hodgkins, NP . Christell Faith, PA-C . Marrianne Mood, PA-C  Any Other Special Instructions Will Be Listed Below (If Applicable).  COVID-19 Vaccine Information can be found at: ShippingScam.co.uk For questions related to vaccine distribution or appointments, please  email vaccine@ .com or call [phone].

## 2020-02-27 ENCOUNTER — Other Ambulatory Visit
Admission: RE | Admit: 2020-02-27 | Discharge: 2020-02-27 | Disposition: A | Discharge status: Home / Self Care | Payer: Medicare Other | Source: Ambulatory Visit | Attending: Cardiovascular Disease | Admitting: Cardiovascular Disease

## 2020-02-27 DIAGNOSIS — Z20822 Contact with and (suspected) exposure to covid-19: Secondary | ICD-10-CM | POA: Insufficient documentation

## 2020-02-27 DIAGNOSIS — Z01812 Encounter for preprocedural laboratory examination: Secondary | ICD-10-CM | POA: Insufficient documentation

## 2020-02-27 LAB — BASIC METABOLIC PANEL
BUN/Creatinine Ratio: 16 (ref 10–24)
BUN: 16 mg/dL (ref 8–27)
CO2: 22 mmol/L (ref 20–29)
Calcium: 9.2 mg/dL (ref 8.6–10.2)
Chloride: 102 mmol/L (ref 96–106)
Creatinine, Ser: 1.03 mg/dL (ref 0.76–1.27)
GFR calc Af Amer: 86 mL/min/{1.73_m2} (ref >59)
GFR calc non Af Amer: 74 mL/min/{1.73_m2} (ref >59)
Glucose: 340 mg/dL — ABNORMAL HIGH (ref 65–99)
Potassium: 5.1 mmol/L (ref 3.5–5.2)
Sodium: 139 mmol/L (ref 134–144)

## 2020-02-27 LAB — CBC
Hematocrit: 40.4 % (ref 37.5–51.0)
Hemoglobin: 13.2 g/dL (ref 13.0–17.7)
MCH: 29.7 pg (ref 26.6–33.0)
MCHC: 32.7 g/dL (ref 31.5–35.7)
MCV: 91 fL (ref 79–97)
Platelets: 214 10*3/uL (ref 150–450)
RBC: 4.44 x10E6/uL (ref 4.14–5.80)
RDW: 12.5 % (ref 11.6–15.4)
WBC: 5.7 10*3/uL (ref 3.4–10.8)

## 2020-02-27 LAB — SARS CORONAVIRUS 2 (TAT 6-24 HRS): SARS Coronavirus 2: NEGATIVE

## 2020-02-29 ENCOUNTER — Other Ambulatory Visit: Payer: Self-pay | Admitting: Cardiovascular Disease

## 2020-02-29 DIAGNOSIS — I350 Nonrheumatic aortic (valve) stenosis: Secondary | ICD-10-CM

## 2020-03-01 ENCOUNTER — Ambulatory Visit
Admission: RE | Admit: 2020-03-01 | Discharge: 2020-03-01 | Disposition: A | Discharge status: Home / Self Care | Payer: Medicare Other | Attending: Cardiovascular Disease | Admitting: Cardiovascular Disease

## 2020-03-01 ENCOUNTER — Encounter
Admission: RE | Disposition: A | Discharge status: Home / Self Care | Payer: Self-pay | Source: Home / Self Care | Attending: Cardiovascular Disease

## 2020-03-01 ENCOUNTER — Ambulatory Visit (HOSPITAL_BASED_OUTPATIENT_CLINIC_OR_DEPARTMENT_OTHER)
Admission: RE | Admit: 2020-03-01 | Discharge: 2020-03-01 | Disposition: A | Discharge status: Home / Self Care | Payer: Medicare Other | Source: Home / Self Care | Attending: Cardiovascular Disease | Admitting: Cardiovascular Disease

## 2020-03-01 ENCOUNTER — Other Ambulatory Visit: Payer: Self-pay

## 2020-03-01 ENCOUNTER — Encounter: Payer: Self-pay | Admitting: Cardiovascular Disease

## 2020-03-01 DIAGNOSIS — I080 Rheumatic disorders of both mitral and aortic valves: Secondary | ICD-10-CM | POA: Insufficient documentation

## 2020-03-01 DIAGNOSIS — E109 Type 1 diabetes mellitus without complications: Secondary | ICD-10-CM | POA: Insufficient documentation

## 2020-03-01 DIAGNOSIS — Z7982 Long term (current) use of aspirin: Secondary | ICD-10-CM | POA: Insufficient documentation

## 2020-03-01 DIAGNOSIS — Z888 Allergy status to other drugs, medicaments and biological substances status: Secondary | ICD-10-CM | POA: Insufficient documentation

## 2020-03-01 DIAGNOSIS — I10 Essential (primary) hypertension: Secondary | ICD-10-CM | POA: Diagnosis not present

## 2020-03-01 DIAGNOSIS — Z833 Family history of diabetes mellitus: Secondary | ICD-10-CM | POA: Diagnosis not present

## 2020-03-01 DIAGNOSIS — Z8249 Family history of ischemic heart disease and other diseases of the circulatory system: Secondary | ICD-10-CM | POA: Insufficient documentation

## 2020-03-01 DIAGNOSIS — I340 Nonrheumatic mitral (valve) insufficiency: Secondary | ICD-10-CM | POA: Diagnosis not present

## 2020-03-01 DIAGNOSIS — Z794 Long term (current) use of insulin: Secondary | ICD-10-CM | POA: Insufficient documentation

## 2020-03-01 DIAGNOSIS — Z9641 Presence of insulin pump (external) (internal): Secondary | ICD-10-CM | POA: Diagnosis not present

## 2020-03-01 DIAGNOSIS — I350 Nonrheumatic aortic (valve) stenosis: Secondary | ICD-10-CM

## 2020-03-01 DIAGNOSIS — Z79899 Other long term (current) drug therapy: Secondary | ICD-10-CM | POA: Insufficient documentation

## 2020-03-01 DIAGNOSIS — Z791 Long term (current) use of non-steroidal anti-inflammatories (NSAID): Secondary | ICD-10-CM | POA: Insufficient documentation

## 2020-03-01 HISTORY — PX: TEE WITHOUT CARDIOVERSION: SHX5443

## 2020-03-01 SURGERY — ECHOCARDIOGRAM, TRANSESOPHAGEAL
Anesthesia: Moderate Sedation

## 2020-03-01 MED ORDER — MIDAZOLAM HCL 2 MG/2ML IJ SOLN
INTRAMUSCULAR | Status: AC
  Filled 2020-03-01: qty 2

## 2020-03-01 MED ORDER — LIDOCAINE VISCOUS HCL 2 % MT SOLN
OROMUCOSAL | Status: AC
  Filled 2020-03-01: qty 15

## 2020-03-01 MED ORDER — FENTANYL CITRATE (PF) 100 MCG/2ML IJ SOLN
INTRAMUSCULAR | Status: AC
  Filled 2020-03-01: qty 2

## 2020-03-01 MED ORDER — SODIUM CHLORIDE 0.9 % IV SOLN: INTRAVENOUS | Status: DC

## 2020-03-01 MED ORDER — MIDAZOLAM HCL 2 MG/2ML IJ SOLN
INTRAMUSCULAR | Status: AC | PRN
  Administered 2020-03-01: 1 mg via INTRAVENOUS

## 2020-03-01 MED ORDER — FENTANYL CITRATE (PF) 100 MCG/2ML IJ SOLN
INTRAMUSCULAR | Status: AC | PRN
  Administered 2020-03-01: 25 ug via INTRAVENOUS
  Administered 2020-03-01 (×2): 50 ug via INTRAVENOUS

## 2020-03-01 MED ORDER — MIDAZOLAM HCL 5 MG/5ML IJ SOLN
INTRAMUSCULAR | Status: AC | PRN
  Administered 2020-03-01 (×2): 2 mg via INTRAVENOUS
  Administered 2020-03-01: 1 mg via INTRAVENOUS

## 2020-03-01 MED ORDER — MIDAZOLAM HCL 5 MG/5ML IJ SOLN
INTRAMUSCULAR | Status: AC
  Filled 2020-03-01: qty 5

## 2020-03-01 MED ORDER — BUTAMBEN-TETRACAINE-BENZOCAINE 2-2-14 % EX AERO
INHALATION_SPRAY | CUTANEOUS | Status: AC
  Filled 2020-03-01: qty 5

## 2020-03-01 MED ORDER — SODIUM CHLORIDE FLUSH 0.9 % IV SOLN
INTRAVENOUS | Status: AC
  Filled 2020-03-01: qty 10

## 2020-03-01 NOTE — Progress Notes (Signed)
*  PRELIMINARY RESULTS* Echocardiogram Echocardiogram Transesophageal has been performed.  Frank Mckee, Frank Mckee 03/01/2020, 8:45 AM

## 2020-03-03 ENCOUNTER — Encounter: Payer: Self-pay | Admitting: Cardiovascular Disease

## 2020-03-03 NOTE — Interval H&P Note (Signed)
History and Physical Interval Note:  03/03/2020 12:35 PM  Frank Mckee  has presented today for surgery, with the diagnosis of TEE   Severe Aortic Valve Stenosis.  The various methods of treatment have been discussed with the patient and family. After consideration of risks, benefits and other options for treatment, the patient has consented to  Procedure(s): TRANSESOPHAGEAL ECHOCARDIOGRAM (TEE) (N/A) as a surgical intervention.  The patient's history has been reviewed, patient examined, no change in status, stable for surgery.  I have reviewed the patient's chart and labs.  Questions were answered to the patient's satisfaction.     Ida Rogue

## 2020-03-03 NOTE — H&P (Signed)
H&P Addendum, preTEE  Patient was seen and evaluated prior to Cardiac catheterization procedure Symptoms, prior testing details again confirmed with the patient Patient examined, no significant change from prior exam Lab work reviewed in detail personally by myself Patient understands risk and benefit of the procedure, willing to proceed  Signed, Tim Atiyana Welte, MD, Ph.D [facility]    

## 2020-03-17 NOTE — Progress Notes (Signed)
TRANSESOPHOGEAL ECHO REPORT       Patient Name:  Frank Mckee Date of Exam: 03/01/2020  Medical Rec #: [id number]   Height:    69.0 in  Accession #:  [id number]   Weight:    190.0 lb  Date of Birth: 1950/06/29   BSA:     2.021 m  Patient Age:  70 years    BP:      130/60 mmHg  Patient Gender: M       HR:      61 bpm.  Exam Location: [facility]   Procedure: Transesophageal Echo, Cardiac Doppler and Color Doppler   Indications:   Not listed on TEE check-in sheet    History:     Patient has prior history of Echocardiogram examinations,  most          recent 11/22/2019. Signs/Symptoms:Murmur; Risk          Factors:Hypertension and Diabetes.    Sonographer:   Sherrie Sport RDCS (AE)  Referring Phys: [place]  Diagnosing Phys: Ida Rogue MD   PROCEDURE: The transesophogeal probe was passed without difficulty through  the esophogus of the patient. Local oropharyngeal anesthetic was provided  with Cetacaine and Benzocaine spray. Sedation performed by performing  physician. Patients was under  conscious sedation during this procedure. Anesthetic administered: 171mcg  of Fentanyl, 4.0mg  of Versed. Image quality was excellent. The patient's  vital signs; including heart rate, blood pressure, and oxygen saturation;  remained stable throughout the  procedure. The patient developed no complications during the procedure.   IMPRESSIONS    1. Left ventricular ejection fraction, by estimation, is 55 to 60%. The  left ventricle has normal function. The left ventricle has no regional  wall motion abnormalities.  2. Right ventricular systolic function is normal. The right ventricular  size is normal.  3. Mild mitral valve regurgitation.  4. There is severe calcifcation of the aortic valve. Aortic valve  regurgitation is trivial. Moderate aortic valve stenosis by estimated AVA  by planar measurement  1.28 cm sq. Right coroncary cusp is most mobile. LCC  and NCC appear partially fused.  5. No left atrial/left atrial appendage thrombus was detected.    FINDINGS  Left Ventricle: Left ventricular ejection fraction, by estimation, is 55  to 60%. The left ventricle has normal function. The left ventricle has no  regional wall motion abnormalities. The left ventricular internal cavity  size was normal in size. There is  no left ventricular hypertrophy.   Right Ventricle: The right ventricular size is normal. No increase in  right ventricular wall thickness. Right ventricular systolic function is  normal.   Left Atrium: Left atrial size was normal in size. No left atrial/left  atrial appendage thrombus was detected.   Right Atrium: Right atrial size was normal in size.   Pericardium: There is no evidence of pericardial effusion.   Mitral Valve: The mitral valve is normal in structure. Mild mitral valve  regurgitation. No evidence of mitral valve stenosis.   Tricuspid Valve: The tricuspid valve is normal in structure. Tricuspid  valve regurgitation is not demonstrated. No evidence of tricuspid  stenosis.   Aortic Valve: The aortic valve is normal in structure. There is severe  calcifcation of the aortic valve. Aortic valve regurgitation is trivial.  Moderate aortic stenosis is present.   Pulmonic Valve: The pulmonic valve was normal in structure. Pulmonic valve  regurgitation is not visualized. No evidence of pulmonic stenosis.   Aorta: The  aortic root is normal in size and structure.   Venous: The inferior vena cava is normal in size with greater than 50%  respiratory variability, suggesting right atrial pressure of 3 mmHg.   IAS/Shunts: No atrial level shunt detected by color flow Doppler.   Ida Rogue MD  Electronically signed by Ida Rogue MD  Signature Date/Time: 03/01/2020/5:33:24 PM

## 2020-03-17 NOTE — Hospital Course (Signed)
H&P Addendum, preTEE  Patient was seen and evaluated prior to Cardiac catheterization procedure Symptoms, prior testing details again confirmed with the patient Patient examined, no significant change from prior exam Lab work reviewed in detail personally by myself Patient understands risk and benefit of the procedure, willing to proceed  Signed, Esmond Plants, MD, Ph.D [facility]

## 2020-04-29 ENCOUNTER — Ambulatory Visit: Payer: Medicare Other | Admitting: Cardiovascular Disease
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
## 2020-05-10 ENCOUNTER — Other Ambulatory Visit: Payer: Self-pay

## 2020-05-10 ENCOUNTER — Encounter: Payer: Self-pay | Admitting: Cardiovascular Disease

## 2020-05-10 ENCOUNTER — Ambulatory Visit (INDEPENDENT_AMBULATORY_CARE_PROVIDER_SITE_OTHER): Payer: Medicare Other | Admitting: Cardiovascular Disease

## 2020-05-10 VITALS — BP 120/52 | HR 61 | Ht 69.0 in | Wt 190.4 lb

## 2020-05-10 DIAGNOSIS — I350 Nonrheumatic aortic (valve) stenosis: Secondary | ICD-10-CM

## 2020-05-10 DIAGNOSIS — I479 Paroxysmal tachycardia, unspecified: Secondary | ICD-10-CM

## 2020-05-10 DIAGNOSIS — E782 Mixed hyperlipidemia: Secondary | ICD-10-CM

## 2020-05-10 DIAGNOSIS — Z794 Long term (current) use of insulin: Secondary | ICD-10-CM

## 2020-05-10 DIAGNOSIS — E119 Type 2 diabetes mellitus without complications: Secondary | ICD-10-CM

## 2020-05-10 DIAGNOSIS — I10 Essential (primary) hypertension: Secondary | ICD-10-CM

## 2020-05-10 NOTE — Patient Instructions (Addendum)
   Medication Instructions:  No changes  Lab work: No new labs needed  Testing/Procedures: Echo in Jan or Feb  2023, (aortic valve stenosis)  Your physician has requested that you have an echocardiogram. Echocardiography is a painless test that uses sound waves to create images of your heart. It provides your doctor with information about the size and shape of your heart and how well your heart's chambers and valves are working. This procedure takes approximately one hour. There are no restrictions for this procedure.  There is a possibility that an IV may need to be started during your test to inject an image enhancing agent. This is done to obtain more optimal pictures of your heart. Therefore we ask that you do at least drink some water prior to coming in to hydrate your veins.    Follow-Up:  . You will need a follow up appointment in 12 months after echo  . Providers on your designated Care Team:   . Murray Hodgkins, NP . Christell Faith, PA-C . Marrianne Mood, PA-C   COVID-19 Vaccine Information can be found at: ShippingScam.co.uk For questions related to vaccine distribution or appointments, please email vaccine@[email or web address] or call [phone].

## 2020-05-16 ENCOUNTER — Telehealth: Payer: Self-pay | Admitting: Cardiovascular Disease

## 2020-05-16 NOTE — Telephone Encounter (Signed)
Pre-op request for septoplasty asking for both medical and pharmacological clearance. Appears she is only on Aspirin. CT in 2016 showed no significant CAD. Also has hyperthyroidism, HTN, tachycardia, severe AS, HLD, DM2, lower extremity edema. Recently seen 05/10/20 and was asymptomatic.   Dr. Rockey Situ, please comment on Aspirin and route to P CV DIV PREOP.  Called patient to evaluate medical clearance, no answer and unable to LVM.

## 2020-05-16 NOTE — Telephone Encounter (Signed)
   [facility] Pre-operative Risk Assessment    Patient Name: Frank Mckee  DOB: 11/28/1950  MRN: [id number]   HEARTCARE STAFF: - Please ensure there is not already an duplicate clearance open for this procedure. - Under Visit Info/Reason for Call, type in Other and utilize the format Clearance MM/DD/YY or Clearance TBD. Do not use dashes or single digits. - If request is for dental extraction, please clarify the # of teeth to be extracted.  Request for surgical clearance:  1. What type of surgery is being performed? septoplasty  2. When is this surgery scheduled? 05/30/20  3. What type of clearance is required (medical clearance vs. Pharmacy clearance to hold med vs. Both)? Both   4. Are there any medications that need to be held prior to surgery and how long? Not listed, please advise if needed   5. Practice name and name of physician performing surgery? [facility] - Dr Margaretha Sheffield   6. What is the office phone number? [phone]   7.   What is the office fax number? [phone]   8.   Anesthesia type (None, local, MAC, general) ? Not listed    Ace Gins 05/16/2020, 2:22 PM  _________________________________________________________________   (provider comments below)

## 2020-05-19 NOTE — Telephone Encounter (Signed)
Acceptable risk , ok to hold aspirin No further testing neded

## 2020-05-20 NOTE — Telephone Encounter (Signed)
    Frank Mckee DOB:  29-Jun-1950  MRN:  [id number]   Primary Cardiologist: Ida Rogue, MD  Chart reviewed as part of pre-operative protocol coverage. Given past medical history and time since last visit, based on ACC/AHA guidelines, Frank Mckee would be at acceptable risk for the planned procedure without further cardiovascular testing.   The patient was advised that if he develops new symptoms prior to surgery to contact our office to arrange for a follow-up visit, and he verbalized understanding.  Patient may hold aspirin for 5 to 7 days prior to the surgery and restart as soon as possible afterward at the surgeon's discretion  I will route this recommendation to the requesting party via [facility] fax function and remove from pre-op pool.  Please call with questions.  [place], [place] 05/20/2020, 4:42 PM

## 2020-05-22 ENCOUNTER — Encounter: Payer: Self-pay | Admitting: Otolaryngology

## 2020-05-23 NOTE — Discharge Instructions (Signed)
[facility] ENDOSCOPIC SINUS SURGERY [facility]  What is Functional Endoscopic Sinus Surgery?  The Surgery involves making the natural openings of the sinuses larger by removing the bony partitions that separate the sinuses from the nasal cavity.  The natural sinus lining is preserved as much as possible to allow the sinuses to resume normal function after the surgery.  In some patients nasal polyps (excessively swollen lining of the sinuses) may be removed to relieve obstruction of the sinus openings.  The surgery is performed through the nose using lighted scopes, which eliminates the need for incisions on the face.  A septoplasty is a different procedure which is sometimes performed with sinus surgery.  It involves straightening the boy partition that separates the two sides of your nose.  A crooked or deviated septum may need repair if is obstructing the sinuses or nasal airflow.  Turbinate reduction is also often performed during sinus surgery.  The turbinates are bony proturberances from the side walls of the nose which swell and can obstruct the nose in patients with sinus and allergy problems.  Their size can be surgically reduced to help relieve nasal obstruction.  What Can Sinus Surgery Do For Me?  Sinus surgery can reduce the frequency of sinus infections requiring antibiotic treatment.  This can provide improvement in nasal congestion, post-nasal drainage, facial pressure and nasal obstruction.  Surgery will NOT prevent you from ever having an infection again, so it usually only for patients who get infections 4 or more times yearly requiring antibiotics, or for infections that do not clear with antibiotics.  It will not cure nasal allergies, so patients with allergies may still require medication to treat their allergies after surgery. Surgery may improve headaches related to sinusitis, however, some people will continue to  require medication to control sinus headaches related to allergies.  Surgery will do nothing for other forms of headache (migraine, tension or cluster).  What Are the Risks of Endoscopic Sinus Surgery?  Current techniques allow surgery to be performed safely with little risk, however, there are rare complications that patients should be aware of.  Because the sinuses are located around the eyes, there is risk of eye injury, including blindness, though again, this would be quite rare. This is usually a result of bleeding behind the eye during surgery, which puts the vision oat risk, though there are treatments to protect the vision and prevent permanent disrupted by surgery causing a leak of the spinal fluid that surrounds the brain.  More serious complications would include bleeding inside the brain cavity or damage to the brain.  Again, all of these complications are uncommon, and spinal fluid leaks can be safely managed surgically if they occur.  The most common complication of sinus surgery is bleeding from the nose, which may require packing or cauterization of the nose.  Continued sinus have polyps may experience recurrence of the polyps requiring revision surgery.  Alterations of sense of smell or injury to the tear ducts are also rare complications.   What is the Surgery Like, and what is the Recovery?  The Surgery usually takes a couple of hours to perform, and is usually performed under a general anesthetic (completely asleep).  Patients are usually discharged home after a couple of hours.  Sometimes during surgery it is necessary to pack the nose to control bleeding, and the packing is left in place for 24 - 48 hours, and removed by your surgeon.    If a septoplasty was performed during the procedure, there is often a splint placed which must be removed after 5-7 days.   Discomfort: Pain is usually mild to moderate, and can be controlled by prescription pain medication or acetaminophen (Tylenol).   Aspirin, Ibuprofen (Advil, Motrin), or Naprosyn (Aleve) should be avoided, as they can cause increased bleeding.  Most patients feel sinus pressure like they have a bad head cold for several days.  Sleeping with your head elevated can help reduce swelling and facial pressure, as can ice packs over the face.  A humidifier may be helpful to keep the mucous and blood from drying in the nose.   Diet: There are no specific diet restrictions, however, you should generally start with clear liquids and a light diet of bland foods because the anesthetic can cause some nausea.  Advance your diet depending on how your stomach feels.  Taking your pain medication with food will often help reduce stomach upset which pain medications can cause.  Nasal Saline Irrigation: It is important to remove blood clots and dried mucous from the nose as it is healing.  This is done by having you irrigate the nose at least 3 - 4 times daily with a salt water solution.  We recommend using NeilMed Sinus Rinse (available at the drug store).  Fill the squeeze bottle with the solution, bend over a sink, and insert the tip of the squeeze bottle into the nose  of an inch.  Point the tip of the squeeze bottle towards the inside corner of the eye on the same side your irrigating.  Squeeze the bottle and gently irrigate the nose.  If you bend forward as you do this, most of the fluid will flow back out of the nose, instead of down your throat.   The solution should be warm, near body temperature, when you irrigate.   Each time you irrigate, you should use a full squeeze bottle.   Note that if you are instructed to use Nasal Steroid Sprays at any time after your surgery, irrigate with saline BEFORE using the steroid spray, so you do not wash it all out of the nose. Another product, Nasal Saline Gel (such as AYR Nasal Saline Gel) can be applied in each nostril 3 - 4 times daily to moisture the nose and reduce scabbing or crusting.  Bleeding:   Bloody drainage from the nose can be expected for several days, and patients are instructed to irrigate their nose frequently with salt water to help remove mucous and blood clots.  The drainage may be dark red or brown, though some fresh blood may be seen intermittently, especially after irrigation.  Do not blow you nose, as bleeding may occur. If you must sneeze, keep your mouth open to allow air to escape through your mouth.  If heavy bleeding occurs: Irrigate the nose with saline to rinse out clots, then spray the nose 3 - 4 times with Afrin Nasal Decongestant Spray.  The spray will constrict the blood vessels to slow bleeding.  Pinch the lower half of your nose shut to apply pressure, and lay down with your head elevated.  Ice packs over the nose may help as well. If bleeding persists despite these measures, you should notify your doctor.  Do not use the Afrin routinely to control nasal congestion after surgery, as it can result in worsening congestion and may affect healing.     Activity: Return to work varies among patients. Most patients will be   out of work at least 5 - 7 days to recover.  Patient may return to work after they are off of narcotic pain medication, and feeling well enough to perform the functions of their job.  Patients must avoid heavy lifting (over 10 pounds) or strenuous physical for 2 weeks after surgery, so your employer may need to assign you to light duty, or keep you out of work longer if light duty is not possible.  NOTE: you should not drive, operate dangerous machinery, do any mentally demanding tasks or make any important legal or financial decisions while on narcotic pain medication and recovering from the general anesthetic.    Call Your Doctor Immediately if You Have Any of the Following: 1. Bleeding that you cannot control with the above measures 2. Loss of vision, double vision, bulging of the eye or black eyes. 3. Fever over 101 degrees 4. Neck stiffness with  severe headache, fever, nausea and change in mental state. You are always encourage to call anytime with concerns, however, please call with requests for pain medication refills during office hours.  Office Endoscopy: During follow-up visits your doctor will remove any packing or splints that may have been placed and evaluate and clean your sinuses endoscopically.  Topical anesthetic will be used to make this as comfortable as possible, though you may want to take your pain medication prior to the visit.  How often this will need to be done varies from patient to patient.  After complete recovery from the surgery, you may need follow-up endoscopy from time to time, particularly if there is concern of recurrent infection or nasal polyps.    General Anesthesia, Adult, Care After This sheet gives you information about how to care for yourself after your procedure. Your health care provider may also give you more specific instructions. If you have problems or questions, contact your health care provider. What can I expect after the procedure? After the procedure, the following side effects are common:  Pain or discomfort at the IV site.  Nausea.  Vomiting.  Sore throat.  Trouble concentrating.  Feeling cold or chills.  Feeling weak or tired.  Sleepiness and fatigue.  Soreness and body aches. These side effects can affect parts of the body that were not involved in surgery. Follow these instructions at home: For the time period you were told by your health care provider:  Rest.  Do not participate in activities where you could fall or become injured.  Do not drive or use machinery.  Do not drink alcohol.  Do not take sleeping pills or medicines that cause drowsiness.  Do not make important decisions or sign legal documents.  Do not take care of children on your own.   Eating and drinking  Follow any instructions from your health care provider about eating or drinking  restrictions.  When you feel hungry, start by eating small amounts of foods that are soft and easy to digest (bland), such as toast. Gradually return to your regular diet.  Drink enough fluid to keep your urine pale yellow.  If you vomit, rehydrate by drinking water, juice, or clear broth. General instructions  If you have sleep apnea, surgery and certain medicines can increase your risk for breathing problems. Follow instructions from your health care provider about wearing your sleep device: ? Anytime you are sleeping, including during daytime naps. ? While taking prescription pain medicines, sleeping medicines, or medicines that make you drowsy.  Have a responsible adult stay with you  for the time you are told. It is important to have someone help care for you until you are awake and alert.  Return to your normal activities as told by your health care provider. Ask your health care provider what activities are safe for you.  Take over-the-counter and prescription medicines only as told by your health care provider.  If you smoke, do not smoke without supervision.  Keep all follow-up visits as told by your health care provider. This is important. Contact a health care provider if:  You have nausea or vomiting that does not get better with medicine.  You cannot eat or drink without vomiting.  You have pain that does not get better with medicine.  You are unable to pass urine.  You develop a skin rash.  You have a fever.  You have redness around your IV site that gets worse. Get help right away if:  You have difficulty breathing.  You have chest pain.  You have blood in your urine or stool, or you vomit blood. Summary  After the procedure, it is common to have a sore throat or nausea. It is also common to feel tired.  Have a responsible adult stay with you for the time you are told. It is important to have someone help care for you until you are awake and  alert.  When you feel hungry, start by eating small amounts of foods that are soft and easy to digest (bland), such as toast. Gradually return to your regular diet.  Drink enough fluid to keep your urine pale yellow.  Return to your normal activities as told by your health care provider. Ask your health care provider what activities are safe for you. This information is not intended to replace advice given to you by your health care provider. Make sure you discuss any questions you have with your health care provider. Document Revised: 10/05/2019 Document Reviewed: 05/04/2019 Elsevier Patient Education  2021 [employer].

## 2020-05-28 ENCOUNTER — Other Ambulatory Visit
Admission: RE | Admit: 2020-05-28 | Discharge: 2020-05-28 | Disposition: A | Discharge status: Home / Self Care | Payer: Medicare Other | Source: Ambulatory Visit | Attending: Otolaryngology | Admitting: Otolaryngology

## 2020-05-28 ENCOUNTER — Other Ambulatory Visit: Payer: Self-pay

## 2020-05-28 DIAGNOSIS — Z01812 Encounter for preprocedural laboratory examination: Secondary | ICD-10-CM | POA: Insufficient documentation

## 2020-05-28 DIAGNOSIS — Z20822 Contact with and (suspected) exposure to covid-19: Secondary | ICD-10-CM | POA: Insufficient documentation

## 2020-05-29 LAB — SARS CORONAVIRUS 2 (TAT 6-24 HRS): SARS Coronavirus 2: NEGATIVE

## 2020-05-30 ENCOUNTER — Encounter: Payer: Self-pay | Admitting: Otolaryngology

## 2020-05-30 ENCOUNTER — Ambulatory Visit: Payer: Medicare Other | Admitting: Anesthesiology

## 2020-05-30 ENCOUNTER — Encounter
Admission: RE | Disposition: A | Discharge status: Home / Self Care | Payer: Self-pay | Source: Home / Self Care | Attending: Otolaryngology

## 2020-05-30 ENCOUNTER — Other Ambulatory Visit: Payer: Self-pay

## 2020-05-30 ENCOUNTER — Ambulatory Visit
Admission: RE | Admit: 2020-05-30 | Discharge: 2020-05-30 | Disposition: A | Discharge status: Home / Self Care | Payer: Medicare Other | Attending: Otolaryngology | Admitting: Otolaryngology

## 2020-05-30 DIAGNOSIS — J343 Hypertrophy of nasal turbinates: Secondary | ICD-10-CM | POA: Diagnosis not present

## 2020-05-30 DIAGNOSIS — J342 Deviated nasal septum: Secondary | ICD-10-CM | POA: Insufficient documentation

## 2020-05-30 DIAGNOSIS — Z79899 Other long term (current) drug therapy: Secondary | ICD-10-CM | POA: Insufficient documentation

## 2020-05-30 DIAGNOSIS — J3489 Other specified disorders of nose and nasal sinuses: Secondary | ICD-10-CM | POA: Diagnosis not present

## 2020-05-30 DIAGNOSIS — Z7982 Long term (current) use of aspirin: Secondary | ICD-10-CM | POA: Insufficient documentation

## 2020-05-30 DIAGNOSIS — D140 Benign neoplasm of middle ear, nasal cavity and accessory sinuses: Secondary | ICD-10-CM | POA: Diagnosis not present

## 2020-05-30 DIAGNOSIS — J339 Nasal polyp, unspecified: Secondary | ICD-10-CM | POA: Insufficient documentation

## 2020-05-30 DIAGNOSIS — Z794 Long term (current) use of insulin: Secondary | ICD-10-CM | POA: Insufficient documentation

## 2020-05-30 HISTORY — PX: NASAL SEPTOPLASTY W/ TURBINOPLASTY: SHX2070

## 2020-05-30 HISTORY — PX: EXCISION NASAL MASS: SHX6271

## 2020-05-30 HISTORY — PX: ENDOSCOPIC CONCHA BULLOSA RESECTION: SHX6395

## 2020-05-30 HISTORY — DX: Disorder of thyroid, unspecified: E07.9

## 2020-05-30 LAB — GLUCOSE, CAPILLARY
Glucose-Capillary: 137 mg/dL — ABNORMAL HIGH (ref 70–99)
Glucose-Capillary: 135 mg/dL — ABNORMAL HIGH (ref 70–99)

## 2020-05-30 SURGERY — SEPTOPLASTY, NOSE, WITH NASAL TURBINATE REDUCTION
Anesthesia: General | Site: Nose | Laterality: Right

## 2020-05-30 MED ORDER — ACETAMINOPHEN 160 MG/5ML PO SOLN: 325.0000 mg | ORAL | Status: DC | PRN

## 2020-05-30 MED ORDER — OXYMETAZOLINE HCL 0.05 % NA SOLN
2.0000 | Freq: Once | NASAL | Status: AC
  Administered 2020-05-30: 2 via NASAL

## 2020-05-30 MED ORDER — ONDANSETRON HCL 4 MG/2ML IJ SOLN
INTRAMUSCULAR | Status: DC | PRN
  Administered 2020-05-30: 4 mg via INTRAVENOUS

## 2020-05-30 MED ORDER — EPHEDRINE SULFATE 50 MG/ML IJ SOLN
INTRAMUSCULAR | Status: DC | PRN
  Administered 2020-05-30 (×2): 5 mg via INTRAVENOUS
  Administered 2020-05-30: 10 mg via INTRAVENOUS
  Administered 2020-05-30: 5 mg via INTRAVENOUS

## 2020-05-30 MED ORDER — LIDOCAINE HCL (CARDIAC) PF 100 MG/5ML IV SOSY
PREFILLED_SYRINGE | INTRAVENOUS | Status: DC | PRN
  Administered 2020-05-30: 50 mg via INTRAVENOUS

## 2020-05-30 MED ORDER — MIDAZOLAM HCL 5 MG/5ML IJ SOLN
INTRAMUSCULAR | Status: DC | PRN
  Administered 2020-05-30: 1 mg via INTRAVENOUS

## 2020-05-30 MED ORDER — PREDNISONE 10 MG PO TABS: ORAL_TABLET | ORAL | 0 refills | Status: DC

## 2020-05-30 MED ORDER — CEPHALEXIN 500 MG PO CAPS: 500.0000 mg | ORAL_CAPSULE | Freq: Two times a day (BID) | ORAL | 0 refills | Status: DC

## 2020-05-30 MED ORDER — CEFAZOLIN SODIUM-DEXTROSE 2-4 GM/100ML-% IV SOLN
2.0000 g | Freq: Once | INTRAVENOUS | Status: AC
  Administered 2020-05-30: 2 g via INTRAVENOUS

## 2020-05-30 MED ORDER — PHENYLEPHRINE HCL 0.5 % NA SOLN
NASAL | Status: DC | PRN
  Administered 2020-05-30: 15 mL via TOPICAL

## 2020-05-30 MED ORDER — OXYCODONE HCL 5 MG PO TABS
5.0000 mg | ORAL_TABLET | Freq: Once | ORAL | Status: AC | PRN
  Administered 2020-05-30: 5 mg via ORAL

## 2020-05-30 MED ORDER — HYDROCODONE-ACETAMINOPHEN 5-325 MG PO TABS: 1.0000 | ORAL_TABLET | Freq: Four times a day (QID) | ORAL | 0 refills | Status: AC | PRN

## 2020-05-30 MED ORDER — PROPOFOL 10 MG/ML IV BOLUS
INTRAVENOUS | Status: DC | PRN
  Administered 2020-05-30: 150 mg via INTRAVENOUS

## 2020-05-30 MED ORDER — FENTANYL CITRATE (PF) 100 MCG/2ML IJ SOLN: 25.0000 ug | INTRAMUSCULAR | Status: DC | PRN

## 2020-05-30 MED ORDER — DEXAMETHASONE SODIUM PHOSPHATE 4 MG/ML IJ SOLN
INTRAMUSCULAR | Status: DC | PRN
  Administered 2020-05-30: 8 mg via INTRAVENOUS

## 2020-05-30 MED ORDER — LIDOCAINE-EPINEPHRINE 2 %-1:100000 IJ SOLN
INTRAMUSCULAR | Status: DC | PRN
  Administered 2020-05-30: 1 mL via INTRADERMAL
  Administered 2020-05-30: 6 mL via INTRADERMAL

## 2020-05-30 MED ORDER — LACTATED RINGERS IV SOLN: INTRAVENOUS | Status: DC

## 2020-05-30 MED ORDER — GLYCOPYRROLATE 0.2 MG/ML IJ SOLN
INTRAMUSCULAR | Status: DC | PRN
  Administered 2020-05-30: .1 mg via INTRAVENOUS

## 2020-05-30 MED ORDER — OXYCODONE HCL 5 MG/5ML PO SOLN
5.0000 mg | Freq: Once | ORAL | Status: AC | PRN
Start: 2020-05-30 — End: 2020-05-30

## 2020-05-30 MED ORDER — ACETAMINOPHEN 325 MG PO TABS
325.0000 mg | ORAL_TABLET | ORAL | Status: DC | PRN
Start: 2020-05-30 — End: 2020-05-30

## 2020-05-30 MED ORDER — SUCCINYLCHOLINE CHLORIDE 20 MG/ML IJ SOLN
INTRAMUSCULAR | Status: DC | PRN
  Administered 2020-05-30: 100 mg via INTRAVENOUS

## 2020-05-30 MED ORDER — LACTATED RINGERS IV SOLN: INTRAVENOUS | Status: DC | PRN

## 2020-05-30 MED ORDER — FENTANYL CITRATE (PF) 100 MCG/2ML IJ SOLN
INTRAMUSCULAR | Status: DC | PRN
  Administered 2020-05-30: 50 ug via INTRAVENOUS

## 2020-05-30 SURGICAL SUPPLY — 30 items
CANISTER SUCT 1200ML W/VALVE (MISCELLANEOUS) ×4 IMPLANT
CATH IV 18X1 1/4 SAFELET (CATHETERS) ×4 IMPLANT
COAGULATOR SUCT 8FR VV (MISCELLANEOUS) ×4 IMPLANT
ELECT REM PT RETURN 9FT ADLT (ELECTROSURGICAL) ×4
ELECTRODE REM PT RTRN 9FT ADLT (ELECTROSURGICAL) ×3 IMPLANT
GLOVE PI ULTRA LF STRL 7.5 (GLOVE) ×6 IMPLANT
GLOVE PI ULTRA NON LATEX 7.5 (GLOVE) ×2
GOWN STRL REUS W/ TWL LRG LVL3 (GOWN DISPOSABLE) ×3 IMPLANT
GOWN STRL REUS W/TWL LRG LVL3 (GOWN DISPOSABLE) ×1
IV CATH 18X1 1/4 SAFELET (CATHETERS) ×3
KIT TURNOVER KIT A (KITS) ×4 IMPLANT
NDL ANESTHESIA 27G X 3.5 (NEEDLE) ×3 IMPLANT
NDL HYPO 27GX1-1/4 (NEEDLE) ×3 IMPLANT
NEEDLE ANESTHESIA  27G X 3.5 (NEEDLE) ×1
NEEDLE ANESTHESIA 27G X 3.5 (NEEDLE) ×3 IMPLANT
NEEDLE HYPO 27GX1-1/4 (NEEDLE) ×4 IMPLANT
PACK ENT CUSTOM (PACKS) ×4 IMPLANT
PATTIES SURGICAL .5 X3 (DISPOSABLE) ×4 IMPLANT
SOL ANTI-FOG 6CC FOG-OUT (MISCELLANEOUS) ×3 IMPLANT
SOL FOG-OUT ANTI-FOG 6CC (MISCELLANEOUS) ×1
SPLINT NASAL SEPTAL BLV .50 ST (MISCELLANEOUS) ×4 IMPLANT
STRAP BODY AND KNEE 60X3 (MISCELLANEOUS) ×8 IMPLANT
SUT CHROMIC 3-0 (SUTURE) ×2
SUT CHROMIC 3-0 KS 27XMFL CR (SUTURE) ×6
SUT ETHILON 3-0 KS 30 BLK (SUTURE) ×4 IMPLANT
SUT PLAIN GUT 4-0 (SUTURE) ×4 IMPLANT
SUTURE CHRMC 3-0 KS 27XMFL CR (SUTURE) ×3 IMPLANT
SYR 3ML LL SCALE MARK (SYRINGE) ×4 IMPLANT
TOWEL OR 17X26 4PK STRL BLUE (TOWEL DISPOSABLE) ×4 IMPLANT
WATER STERILE IRR 250ML POUR (IV SOLUTION) ×4 IMPLANT

## 2020-05-30 NOTE — Op Note (Signed)
05/30/2020  10:14 AM  [id number]   Pre-Op Dx:  Deviated Nasal Septum, Hypertrophic Inferior Turbinates, polypoid growth right middle turbinate concha bullosa left middle turbinate, papillomatous growth left anterior superior septum  Post-op Dx: Same  Proc: Nasal Septoplasty, Partial Reduction right inferior Turbinates, excision papillomatous growth left anterior superior septum with closure, removal polypoid tissue right middle turbinate, endoscopic trimming of concha bullosa left middle turbinate  Surg:  Frank Mckee  Anes:  GOT  EBL: 75 mL  Comp: None  Findings: Septum was markedly deviated to the left side anteriorly where the quadrangular cartilage buckled to the left.  At the upper portion of the buccal there was a papillomatous growth of the mucosa of the septum that was approximately 6 to 7 cm in diameter and irregular in shape.  There were a couple small little papillomata's lesion separate from it just inferior to it on the septum.  Posteriorly the ethmoid plate buckled to the left side with a concha bullosa of the left middle turbinate and blockage of the middle meatus.  The right side had polypoid growth on the anterior border of the middle turbinate and the right inferior turbinate was very enlarged.  Procedure: With the patient in a comfortable supine position,  general orotracheal anesthesia was induced without difficulty.     The patient received preoperative Afrin spray for topical decongestion and vasoconstriction.  Intravenous prophylactic antibiotics were administered.  At an appropriate level, the patient was placed in a semi-sitting position.  Nasal vibrissae were trimmed.   1% Xylocaine with 1:100,000 epinephrine, 7 cc's, was infiltrated into the anterior floor of the nose, into the nasal spine region, into the membranous columella, and finally into the submucoperichondrial plane of the septum on both sides.  Several minutes were allowed for this to take effect.   Cottoniod pledgetts soaked in Afrin and 4% Xylocaine were placed into both nasal cavities and left while the patient was prepped and draped in the standard fashion.  The materials were removed from the nose and observed to be intact and correct in number.  The nose was inspected with a headlight and zero degree scope with the findings as described above.  A left hemitransfixion incision was sharply executed and carried down to the quadrangular cartilage. The mucoperichondrium was elelvated on both sides along the quadrangular plate back to the bony-cartilaginous junction.  This elevated the mucoperichondrium underneath the papillomatous growth on the left side of the septal mucosa.  The mucoperiostium was then elevated along the ethmoid plate and the vomer. The boney-catilaginous junction was then split with a freer elevator and the mucoperiosteum was elevated on the opposite side. The mucoperiosteum was then elevated along the maxillary crest as needed to expose the crooked bone of the crest.  Boney spurs of the vomer and maxillary crest were removed with Donavan Foil forceps.  A chisel was used to free up some of the really crooked maxillary crest bone to the left side.  The cartilaginous plate was trimmed along its posterior and inferior borders of about 2 mm of cartilage to free it up inferiorly. Some of the deviated ethmoid plate was then fractured and removed with Takahashi forceps to free up the posterior border of the quadrangular plate and allow it to swing back to the midline.  Cartilage still had a convex city towards the left airway so a morselized were was used to break the spring of the cartilage to allow it to moved back towards the midline.  The mucosal flaps  were placed back into their anatomic position to allow visualization of the airways. The septum now sat in the midline with an improved airway.  The mucosal where the papilloma was was loose as there was no longer cartilage pushing out into  this area.  I was now able to cut around the papilloma and remove it all in 1 piece.  There were a couple small islands just inferior to this that were cauterized rather than trying to remove all this mucoperichondrium.  A 3-0 Chromic suture on a Keith needle in used to anchor the inferior septum at the nasal spine with a through and through suture.  Several sutures were used to anchor this inferiorly and to hold the mucosa here inferiorly as well.  The anterior and posterior borders of the mucoperichondrial incision were loose enough that they could advance towards the midline.  These were then pulled together and sutured with the 4-0 through and through whipstitch.  The mucosal flaps are then sutured together using a through and through whip stitch of 4-0 Plain Gut with a mini-Keith needle. This was used to close the hemitransfixion incision as well.   The inferior turbinates were then inspected. An incision was created along the inferior aspect of the right inferior turbinate with removal of some of the inferior soft tissue and bone. Electrocautery was used to control bleeding in the area. The remaining turbinate was then outfractured to open up the airway further. There was no significant bleeding noted. The right inferior turbinate was inspected and was not overly enlarged.  There was some swelling that was cauterized slightly to help prevent further swelling postop.  The right middle turbinate was visualized with the 0 degree endoscope and showed polypoid changes along its anterior border.  These were trimmed off a Gruenwald forceps and sent for permanent section.  Electrocautery was used along the middle turbinate on the trimmed edge to help control bleeding.  The left middle turbinate was visualized with a 0 degree scope.  Was infractured and the concha bullosa was fractured together some.  Some of the lateral wall the concha bullosa was trimmed.  The turbinate was left medialized some to open up the  middle meatus more.  The airways were then visualized and showed open passageways on both sides that were significantly improved compared to before surgery. There was no signifcant bleeding. Nasal splints were applied to both sides of the septum using Xomed 0.34mm regular sized splints that were trimmed, and then held in position with a 3-0 Nylon through and through suture.  The patient was turned back over to anesthesia, and awakened, extubated, and taken to the PACU in satisfactory condition.  Dispo:   PACU to home  Plan: Ice, elevation, narcotic analgesia, steroid taper, and prophylactic antibiotics for the duration of indwelling nasal foreign bodies.  We will reevaluate the patient in the office in 6 days and remove the septal splints.  Return to work in 10 days, strenuous activities in two weeks.   Frank Mckee 05/30/2020 10:14 AM

## 2020-05-30 NOTE — H&P (Signed)
H&P has been reviewed and patient reevaluated, no changes necessary. To be downloaded later.  

## 2020-05-30 NOTE — Transfer of Care (Signed)
Immediate Anesthesia Transfer of Care Note  Patient: ALEKSEI GOODLIN  Procedure(s) Performed: NASAL SEPTOPLASTY WITH TURBINATE REDUCTION (N/A Nose) ENDOSCOPIC CONCHA BULLOSA RESECTION (Right Nose) EXCISION NASAL MASS (Left Nose)  Patient Location: PACU  Anesthesia Type: General  Level of Consciousness: awake, alert  and patient cooperative  Airway and Oxygen Therapy: Patient Spontanous Breathing and Patient connected to supplemental oxygen  Post-op Assessment: Post-op Vital signs reviewed, Patient's Cardiovascular Status Stable, Respiratory Function Stable, Patent Airway and No signs of Nausea or vomiting  Post-op Vital Signs: Reviewed and stable  Complications: No complications documented.

## 2020-05-30 NOTE — Anesthesia Postprocedure Evaluation (Signed)
Anesthesia Post Note  Patient: Frank Mckee  Procedure(s) Performed: NASAL SEPTOPLASTY WITH TURBINATE REDUCTION (N/A Nose) ENDOSCOPIC CONCHA BULLOSA RESECTION (Right Nose) EXCISION NASAL MASS (Left Nose)     Patient location during evaluation: PACU Anesthesia Type: General Level of consciousness: awake and alert Pain management: pain level controlled Vital Signs Assessment: post-procedure vital signs reviewed and stable Respiratory status: spontaneous breathing, nonlabored ventilation and respiratory function stable Cardiovascular status: blood pressure returned to baseline and stable Postop Assessment: no apparent nausea or vomiting Anesthetic complications: no   No complications documented.  Wanda Plump Mabry Tift

## 2020-05-30 NOTE — Anesthesia Preprocedure Evaluation (Signed)
Anesthesia Evaluation  Patient identified by MRN, date of birth, ID band Patient awake    Reviewed: Allergy & Precautions, NPO status   Airway Mallampati: II  TM Distance: >3 FB Neck ROM: Full    Dental   Pulmonary    Pulmonary exam normal        Cardiovascular hypertension, Normal cardiovascular exam+ Valvular Problems/Murmurs      Neuro/Psych negative neurological ROS     GI/Hepatic GERD  Medicated and Controlled,  Endo/Other  diabetes, Well Controlled, Insulin DependentHypothyroidism   Renal/GU      Musculoskeletal   Abdominal   Peds  Hematology   Anesthesia Other Findings   Reproductive/Obstetrics                             Anesthesia Physical Anesthesia Plan  ASA: II  Anesthesia Plan: General   Post-op Pain Management:    Induction: Intravenous  PONV Risk Score and Plan: 1 and Ondansetron  Airway Management Planned: Oral ETT  Additional Equipment:   Intra-op Plan:   Post-operative Plan: Extubation in OR  Informed Consent: I have reviewed the patients History and Physical, chart, labs and discussed the procedure including the risks, benefits and alternatives for the proposed anesthesia with the patient or authorized representative who has indicated his/her understanding and acceptance.       Plan Discussed with: CRNA, Anesthesiologist and Surgeon  Anesthesia Plan Comments:         Anesthesia Quick Evaluation

## 2020-05-30 NOTE — Anesthesia Procedure Notes (Signed)
Procedure Name: Intubation Date/Time: 05/30/2020 8:41 AM Performed by: Mayme Genta, CRNA Pre-anesthesia Checklist: Patient identified, Emergency Drugs available, Suction available, Patient being monitored and Timeout performed Patient Re-evaluated:Patient Re-evaluated prior to induction Oxygen Delivery Method: Circle system utilized Preoxygenation: Pre-oxygenation with 100% oxygen Induction Type: IV induction Ventilation: Mask ventilation without difficulty Laryngoscope Size: Miller and 3 Grade View: Grade I Tube type: Oral Rae Tube size: 7.5 mm Number of attempts: 1 Placement Confirmation: ETT inserted through vocal cords under direct vision,  positive ETCO2 and breath sounds checked- equal and bilateral Tube secured with: Tape Dental Injury: Teeth and Oropharynx as per pre-operative assessment

## 2020-05-31 ENCOUNTER — Encounter: Payer: Self-pay | Admitting: Otolaryngology

## 2020-06-03 LAB — SURGICAL PATHOLOGY

## 2020-08-22 ENCOUNTER — Other Ambulatory Visit: Payer: Self-pay | Admitting: Cardiovascular Disease

## 2021-02-11 ENCOUNTER — Ambulatory Visit (INDEPENDENT_AMBULATORY_CARE_PROVIDER_SITE_OTHER): Payer: Medicare Other

## 2021-02-11 DIAGNOSIS — I350 Nonrheumatic aortic (valve) stenosis: Secondary | ICD-10-CM | POA: Diagnosis not present

## 2021-02-11 LAB — ECHOCARDIOGRAM COMPLETE
AR max vel: 0.95 cm2
AV Area VTI: 0.98 cm2
AV Area mean vel: 0.9 cm2
AV Mean grad: 42.5 mmHg
AV Peak grad: 64.1 mmHg
Ao pk vel: 4 m/s
Area-P 1/2: 2.96 cm2
Calc EF: 57.2 %
S' Lateral: 3.5 cm
Single Plane A2C EF: 59.2 %
Single Plane A4C EF: 57.3 %

## 2021-02-17 ENCOUNTER — Telehealth: Payer: Self-pay

## 2021-02-17 NOTE — Telephone Encounter (Signed)
Able to reach pt regarding his recent ECHO [name] had a chance to review his results and advised   "Echocardiogram  Normal LV function  Aortic valve  measuring severe stenosis  Moderate dilation left atrium  Probably need a office visit to discuss, 1 year from last year, in April of 23 would be okay if he is not having significant symptoms  -Needs first available if having worsening chest tightness, shortness of breath, near syncope "  Frank Mckee very thankful for the phone call of his results, all questions and concerns were address with nothing further at this time. Feels that his cardiac s/s are good at this time, prefers to wait till 79 m f/u, pt schedule for March 20 at 08:40 am. If he does develop worsening chest tightness, shortness of breath, or near syncope he will call back for sooner appt.

## 2021-04-20 NOTE — Progress Notes (Signed)
Cardiology Office Note ? ?Date:  04/21/2021  ? ?ID:  Frank Mckee, DOB [birth year], MRN [id number] ? ?PCP:  Sofie Hartigan, MD  ? ?Chief Complaint  ?Patient presents with  ? 12 month follow up   ?  "Doing well." Medications reviewed by the patient verbally.   ? ? ?HPI:  ?Frank Mckee is a 71 year old gentleman with past medical history of ?Diabetes type 1, on insulin pump ?Hyperlipidemia ?Hypertension ?Thyroid disease/Graves' ?Who presents by referral from primary care for tachycardia, shortness of breath, hyper thyroidism,  severe aortic valve stenosis ? ?In follow-up today further discussion concerning recent echocardiogram ?1 was done through the [facility] system, repeat echocardiogram done at [facility] ?Both documenting severe aortic valve stenosis with high mean gradient ? 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 ?Reports having significant problems with his eyes related to thyroid disease ?Scheduled to have possible surgery, has been treated with steroids ?Feels they are slowly getting better, briefly lost vision in 1 eye ? ?EKG personally reviewed by myself on todays visit ?Normal sinus rhythm rate 73 bpm no significant ST-T wave changes ? ?Echo 1/23 records reviewed ?Left ventricular ejection fraction, by estimation, is 60 to 65%. The  ?left ventricle has normal function. The left ventricle has no regional  ?wall motion abnormalities. Left ventricular diastolic parameters are  ?consistent with Grade I diastolic  ?dysfunction (impaired relaxation). The average left ventricular global  ?longitudinal strain is -16.5 %. The global longitudinal strain is normal.  ? 2. Right ventricular systolic function is normal. The right ventricular  ?size  is normal.  ? 3. Left atrial size was moderately dilated.  ? 4. The mitral valve is normal in structure. Mild mitral valve  ?regurgitation. No evidence of mitral stenosis.  ? 5. The aortic valve is normal in structure. Aortic valve regurgitation is  ?trivial. Severe aortic valve stenosis. Aortic valve area, by VTI measures  ?0.98 cm?Marland Kitchen Aortic valve mean gradient measures 42.5 mmHg. Aortic valve Vmax  ?measures 4.00 m/s.  ? 6. The inferior vena cava is normal in size with greater than 50%  ?respiratory variability, suggesting right atrial pressure of 3 mmHg. ? ? ?Other past medical history reviewed ?Echocardiogram April 2021 normal LV function, grade 2 diastolic dysfunction, moderate to severe aortic valve stenosis mean gradient 36 mmHg ? ?Repeat echocardiogram mean gradient up to 45 mmHg, gradient in the 90s estimated aortic valve area less than 1 cm ? ?CT scan November 2016 chest abdomen pelvis with no significant coronary calcification, minimal abdominal aortic calcification ? ?PMH:   has a past medical history of Diabetes mellitus without complication ([place]), Heart murmur, Hypercholesteremia, Hypertension, and Thyroid disease. ? ?PSH:    ?Past Surgical History:  ?Procedure Laterality Date  ? arm surgery    ? BACK SURGERY    ? COLONOSCOPY WITH PROPOFOL N/A 02/17/2017  ? Procedure: COLONOSCOPY WITH PROPOFOL;  Surgeon: Toledo, Benay Pike, MD;  Location: [facility] ENDOSCOPY;  Service: Gastroenterology;  Laterality: N/A;  ? ENDOSCOPIC CONCHA BULLOSA RESECTION Right 05/30/2020  ? Procedure: ENDOSCOPIC CONCHA BULLOSA RESECTION;  Surgeon: Margaretha Sheffield, MD;  Location: [place];  Service: ENT;  Laterality: Right;  ? EXCISION NASAL MASS Left 05/30/2020  ? Procedure: EXCISION NASAL MASS;  Surgeon: Kathyrn Sheriff,  Eddie Dibbles, MD;  Location: [place];  Service: ENT;  Laterality: Left;  ? EYE SURGERY    ? cataract extraction  ? NASAL SEPTOPLASTY W/ TURBINOPLASTY N/A 05/30/2020  ? Procedure: NASAL SEPTOPLASTY WITH TURBINATE  REDUCTION;  Surgeon: Margaretha Sheffield, MD;  Location: [place];  Service: ENT;  Laterality: N/A;  ? SHOULDER SURGERY    ? TEE WITHOUT CARDIOVERSION N/A 03/01/2020  ? Procedure: TRANSESOPHAGEAL ECHOCARDIOGRAM (TEE);  Surgeon: Minna Merritts, MD;  Location: [facility] ORS;  Service: Cardiovascular;  Laterality: N/A;  ? ? ?Current Outpatient Medications  ?Medication Sig Dispense Refill  ? aspirin 325 MG tablet Take 325 mg by mouth daily.    ? ibuprofen (ADVIL) 200 MG tablet Take 800 mg by mouth daily.    ? insulin aspart (NOVOLOG) 100 UNIT/ML injection INJECT UP TO 60 UNITS UNDER THE SKIN VIA PUMP AS DIRECTED    ? Insulin Disposable Pump (OMNIPOD 5 G6 INTRO, GEN 5,) KIT Inject into the skin.    ? methimazole (TAPAZOLE) 10 MG tablet Take 15 mg by mouth daily.    ? Multiple Vitamin (MULTIVITAMIN) tablet Take 1 tablet by mouth daily.    ? Pitavastatin Calcium 1 MG TABS Take 1 mg by mouth daily.    ? predniSONE (DELTASONE) 10 MG tablet Start with 3 pills tomorrow. Taper over the next 6 days.  3,3,2,2,1,1. 12 tablet 0  ? tamsulosin (FLOMAX) 0.4 MG CAPS capsule Take 0.4 mg by mouth daily.    ? triamcinolone cream (KENALOG) 0.1 % Apply 1 application topically 2 (two) times a week.    ? CALCIUM-VITAMIN D PO Take 1,200 mg by mouth daily at 12 noon. 25 mg vit D (Patient not taking: Reported on 04/21/2021)    ? cephALEXin (KEFLEX) 500 MG capsule Take 1 capsule (500 mg total) by mouth 2 (two) times daily. (Patient not taking: Reported on 04/21/2021) 14 capsule 0  ? EPINEPHrine (EPIPEN 2-PAK) 0.3 mg/0.3 mL IJ SOAJ injection Inject 0.3 mg into the muscle as needed for anaphylaxis. (Patient not taking: Reported on 04/21/2021) 1 each 1  ? losartan (COZAAR) 50 MG tablet TAKE 1 TABLET(50 MG) BY MOUTH DAILY 90 tablet 3  ? Teprotumumab-trbw (TEPEZZA) 500 MG SOLR Inject into the vein See admin instructions. Every three weeks (Patient not taking: Reported on 04/21/2021)    ? ?No current facility-administered medications for this visit.   ? ? ?Allergies:   Atorvastatin and Ezetimibe  ? ?Social History:  The patient  reports that he has never smoked. He has never used smokeless tobacco. He reports that he does not drink alcohol and does not use drugs.  ? ?Family History:   family history includes Diabetes in his father; Healthy in his mother; Hypertension in his father.  ? ? ?Review of Systems: ?Review of Systems  ?Constitutional: Negative.   ?HENT: Negative.    ?Respiratory: Negative.    ?Cardiovascular: Negative.   ?Gastrointestinal: Negative.   ?Musculoskeletal: Negative.   ?Neurological: Negative.   ?Psychiatric/Behavioral: Negative.    ?All other systems reviewed and are negative. ? ?PHYSICAL EXAM: ?VS:  BP 120/64 (BP Location: Left Arm, Patient Position: Sitting, Cuff Size: Normal)   Pulse 73   Ht $R'5\' 9"'bw$  (1.753 m)   Wt 201 lb (91.2 kg)   SpO2 97%   BMI 29.68 kg/m?  , BMI Body mass index is 29.68 kg/m?Marland Kitchen ?Constitutional:  oriented to person, place, and time. No distress.  ?HENT:  ?Head: Grossly normal ?Eyes:  no discharge. No scleral icterus.  ?  Neck: No JVD, no carotid bruits  ?Cardiovascular: Regular rate and rhythm, 3/6 systolic ejection murmur appreciated right sternal border  ?Pulmonary/Chest: Clear to auscultation bilaterally, no wheezes or rails ?Abdominal: Soft.  no distension.  no tenderness.  ?Musculoskeletal: Normal range of motion ?Neurological:  normal muscle tone. Coordination normal. No atrophy ?Skin: Skin warm and dry ?Psychiatric: normal affect, pleasant ? ? ?Recent Labs: ?No results found for requested labs within last 8760 hours.  ? ? ?Lipid Panel ?No results found for: CHOL, HDL, LDLCALC, TRIG ?  ? ?Wt Readings from Last 3 Encounters:  ?04/21/21 201 lb (91.2 kg)  ?05/30/20 179 lb (81.2 kg)  ?05/10/20 190 lb 6 oz (86.4 kg)  ?  ? ?ASSESSMENT AND PLAN: ? ?Problem List Items Addressed This Visit   ?None ?Visit Diagnoses   ? ? Paroxysmal tachycardia ([place])    -  Primary  ? Relevant Medications  ? aspirin 325 MG tablet  ?  Pitavastatin Calcium 1 MG TABS  ? losartan (COZAAR) 50 MG tablet  ? Other Relevant Orders  ? EKG 12-Lead  ? Diabetes mellitus type 2, insulin dependent ([place])      ? Relevant Medications  ? aspirin 325 MG tablet  ? Pitavasta

## 2021-04-21 ENCOUNTER — Encounter: Payer: Self-pay | Admitting: Cardiovascular Disease

## 2021-04-21 ENCOUNTER — Other Ambulatory Visit: Payer: Self-pay

## 2021-04-21 ENCOUNTER — Ambulatory Visit (INDEPENDENT_AMBULATORY_CARE_PROVIDER_SITE_OTHER): Payer: Medicare Other | Admitting: Cardiovascular Disease

## 2021-04-21 VITALS — BP 120/64 | HR 73 | Ht 69.0 in | Wt 201.0 lb

## 2021-04-21 DIAGNOSIS — I350 Nonrheumatic aortic (valve) stenosis: Secondary | ICD-10-CM

## 2021-04-21 DIAGNOSIS — I479 Paroxysmal tachycardia, unspecified: Secondary | ICD-10-CM

## 2021-04-21 DIAGNOSIS — Z794 Long term (current) use of insulin: Secondary | ICD-10-CM | POA: Diagnosis not present

## 2021-04-21 DIAGNOSIS — E782 Mixed hyperlipidemia: Secondary | ICD-10-CM | POA: Diagnosis not present

## 2021-04-21 DIAGNOSIS — E119 Type 2 diabetes mellitus without complications: Secondary | ICD-10-CM

## 2021-04-21 DIAGNOSIS — I10 Essential (primary) hypertension: Secondary | ICD-10-CM

## 2021-04-21 MED ORDER — LOSARTAN POTASSIUM 50 MG PO TABS: ORAL_TABLET | ORAL | 3 refills | Status: DC

## 2021-04-21 NOTE — Patient Instructions (Addendum)
Referral to TAVR clinic for severe aortic valve stenosis was placed to [facility], they will call within a few weeks to make your first appt at their next availability  ? ?Medication Instructions:  ?No changes ? ?If you need a refill on your cardiac medications before your next appointment, please call your pharmacy.  ? ?Lab work: ?No new labs needed ? ?Testing/Procedures: ?No new testing needed ? ?Follow-Up: ?At [facility], you and your health needs are our priority.  As part of our continuing mission to provide you with exceptional heart care, we have created designated Provider Care Teams.  These Care Teams include your primary Cardiologist (physician) and Advanced Practice Providers (APPs -  Physician Assistants and Nurse Practitioners) who all work together to provide you with the care you need, when you need it. ? ?You will need a follow up appointment in 6 months ? ?Providers on your designated Care Team:   ?Murray Hodgkins, NP ?Christell Faith, PA-C ?Cadence Kathlen Mody, PA-C ? ?COVID-19 Vaccine Information can be found at: [email or web address] For questions related to vaccine distribution or appointments, please email [email or web address] or call [phone].  ? ?

## 2021-04-29 ENCOUNTER — Telehealth: Payer: Self-pay | Admitting: Cardiovascular Disease

## 2021-04-29 NOTE — Telephone Encounter (Signed)
? ?  Patient Name: Frank Mckee  ?DOB: [birth year] ?MRN: [id number] ? ?Primary Cardiologist: Ida Rogue, MD ? ?Chart reviewed as part of pre-operative protocol coverage. Patient just recently saw Dr. Rockey Situ on 04/21/21 with concern for severe aortic valve stenosis, referred to structural heart team to consider TAVR. Now seeking surgical clarance for nasal/sinus endoscopy bilaterally under general anesthesia on 05/01/21. ? ?Given structural issues will route to Dr. Rockey Situ for input on proceding with surgery, they are also asking cardiology to advise what medicines need to be held - he is on ASA '325mg'$  daily, do not see this is specifically for any cardiac reasons. Has hx of cor calcification but no hx of PCI/MI that I can see in chart. Dr. Rockey Situ  - please advise on clearance for surgery - - Please route response to P CV DIV PREOP (the pre-op pool). Thank you. Will also send secure chat for him to review given that surgery is 2 days away. ? ?Charlie Pitter, PA-C ?04/29/2021, 4:00 PM ? ? ?

## 2021-04-29 NOTE — H&P (View-Only) (Signed)
? ? ?Patient ID: ?Frank Mckee ?MRN: [id number] ?DOB/AGE: 08-Mar-1950 71 y.o. ? ?Primary Care Physician:Feldpausch, Chrissie Noa, MD ?Primary Cardiologist: Ida Rogue, MD ? ? ?FOCUSED CARDIOVASCULAR PROBLEM LIST:   ?1.  Severe aortic stenosis with aortic valve area of 0.9 cm?, mean gradient 51 mmHg and a peak velocity of 4 m/s with normal ejection fraction; no conduction abnormalities on EKG ?2.  Type 1 diabetes on insulin pump ?3.  Hyperlipidemia ?4.  Hypertension ?5.  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 Sharmaine Base ([facility]) ? ? ?HISTORY OF PRESENT ILLNESS: ?The patient is a 71 y.o. male with the indicated medical history here for recommendations regarding his aortic valvular disease.  Patient had been seen by Dr. Rockey Situ recently.  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  He requires nasal endoscopy under general anesthesia.  Because of these clinical considerations he is referred for recommendations regarding his severe aortic stenosis. ? 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  He was seen at [facility] and ultimately started on a high-dose prednisone taper.  His vision and periorbital swelling have improved and are stable.  He is scheduled for surgical decompression with Dr. Posey Pronto tomorrow under general anesthesia. ? ?In terms of symptoms the patient tells me as he told Dr. Rockey Situ that he is relatively asymptomatic.  He is able to do everything he needs to do in his day.  He is very active.  He hunts, uses his tractor, and fixes his roof without any issues.  He has required no emergency room visits or hospitalizations.  He denies any exertional angina or exertional dyspnea.  He has had no  presyncope or syncope.  He denies any orthopnea or paroxysmal nocturnal dyspnea.  He is otherwise well except for his ophthalmologic disorder. ? ?Of note the patient sees a dentist and has regular cleanings and reports good dental health. ? ?Past Medical History:  ?Diagnosis Date  ? Diabetes mellitus without complication ([place])   ? Heart murmur   ? Hypercholesteremia   ? Hypertension   ? Thyroid disease   ?  ?Past Surgical History:  ?Procedure Laterality Date  ? arm surgery    ? BACK SURGERY    ? COLONOSCOPY WITH PROPOFOL N/A 02/17/2017  ? Procedure: COLONOSCOPY WITH PROPOFOL;  Surgeon: Toledo, Benay Pike, MD;  Location: [facility];  Service: Gastroenterology;  Laterality: N/A;  ? ENDOSCOPIC CONCHA BULLOSA RESECTION Right 05/30/2020  ? Procedure: ENDOSCOPIC CONCHA BULLOSA RESECTION;  Surgeon: Margaretha Sheffield, MD;  Location: [place];  Service: ENT;  Laterality: Right;  ? EXCISION NASAL MASS Left 05/30/2020  ? Procedure: EXCISION NASAL MASS;  Surgeon: Margaretha Sheffield, MD;  Location: [place];  Service: ENT;  Laterality: Left;  ? EYE SURGERY    ? cataract extraction  ? NASAL SEPTOPLASTY W/ TURBINOPLASTY N/A 05/30/2020  ? Procedure: NASAL SEPTOPLASTY WITH TURBINATE REDUCTION;  Surgeon: Margaretha Sheffield, MD;  Location: [place];  Service: ENT;  Laterality: N/A;  ? SHOULDER SURGERY    ? TEE WITHOUT CARDIOVERSION N/A 03/01/2020  ? Procedure: TRANSESOPHAGEAL ECHOCARDIOGRAM (TEE);  Surgeon: Minna Merritts, MD;  Location: [facility];  Service: Cardiovascular;  Laterality: N/A;  ?  ?Family History  ?Problem Relation Age of Onset  ?  Healthy Mother   ? Diabetes Father   ? Hypertension Father   ?  ?Social History  ? ?Socioeconomic History  ? Marital status: Married  ?  Spouse name: Not on file  ? Number of children: Not on file  ? Years of education: Not on file  ? Highest education level: Not on file  ?Occupational History  ? Not on file  ?Tobacco Use  ? Smoking status: Never  ? Smokeless tobacco: Never   ?Vaping Use  ? Vaping Use: Never used  ?Substance and Sexual Activity  ? Alcohol use: No  ? Drug use: No  ? Sexual activity: Not on file  ?Other Topics Concern  ? Not on file  ?Social History Narrative  ? Not on file  ? ?Social Determinants of Health  ? ?Financial Resource Strain: Not on file  ?Food Insecurity: Not on file  ?Transportation Needs: Not on file  ?Physical Activity: Not on file  ?Stress: Not on file  ?Social Connections: Not on file  ?Intimate Partner Violence: Not on file  ?  ? ?Prior to Admission medications   ?Medication Sig Start Date End Date Taking? Authorizing Provider  ?aspirin 325 MG tablet Take 325 mg by mouth daily. 04/04/21 07/03/21  [provider]  ?CALCIUM-VITAMIN D PO Take 1,200 mg by mouth daily at 12 noon. 25 mg vit D ?Patient not taking: Reported on 04/21/2021    [provider]  ?cephALEXin (KEFLEX) 500 MG capsule Take 1 capsule (500 mg total) by mouth 2 (two) times daily. ?Patient not taking: Reported on 04/21/2021 05/30/20   Margaretha Sheffield, MD  ?EPINEPHrine (EPIPEN 2-PAK) 0.3 mg/0.3 mL IJ SOAJ injection Inject 0.3 mg into the muscle as needed for anaphylaxis. ?Patient not taking: Reported on 04/21/2021 10/17/19   Merlyn Lot, MD  ?ibuprofen (ADVIL) 200 MG tablet Take 800 mg by mouth daily.    [provider]  ?insulin aspart (NOVOLOG) 100 UNIT/ML injection INJECT UP TO 60 UNITS UNDER THE SKIN VIA PUMP AS DIRECTED    [provider]  ?Insulin Disposable Pump (OMNIPOD 5 G6 INTRO, GEN 5,) KIT Inject into the skin. 02/07/21   [provider]  ?losartan (COZAAR) 50 MG tablet TAKE 1 TABLET(50 MG) BY MOUTH DAILY 04/21/21   Minna Merritts, MD  ?methimazole (TAPAZOLE) 10 MG tablet Take 15 mg by mouth daily. 05/17/19   [provider]  ?Multiple Vitamin (MULTIVITAMIN) tablet Take 1 tablet by mouth daily.    [provider]  ?Pitavastatin Calcium 1 MG TABS Take 1 mg by mouth daily. 04/08/21   [provider]  ?predniSONE  (DELTASONE) 10 MG tablet Start with 3 pills tomorrow. Taper over the next 6 days.  3,3,2,2,1,1. 05/30/20   Margaretha Sheffield, MD  ?tamsulosin (FLOMAX) 0.4 MG CAPS capsule Take 0.4 mg by mouth daily. 02/18/19   [provider]  ?Teprotumumab-trbw (TEPEZZA) 500 MG SOLR Inject into the vein See admin instructions. Every three weeks ?Patient not taking: Reported on 04/21/2021    [provider]  ?triamcinolone cream (KENALOG) 0.1 % Apply 1 application topically 2 (two) times a week. 09/14/16   [provider]  ? ? ?Allergies  ?Allergen Reactions  ? Atorvastatin Other (See Comments)  ?  Muscle cramp  ? Ezetimibe Other (See Comments)  ? ? ?REVIEW OF SYSTEMS:  ?General: no fevers/chills/night sweats ?Eyes: blurry vision, diplopia, or amaurosis ?ENT: no sore throat or hearing loss ?Resp: no cough, wheezing, or hemoptysis ?CV: no edema or palpitations ?GI: no abdominal  pain, nausea, vomiting, diarrhea, or constipation ?GU: no dysuria, frequency, or hematuria ?Skin: no rash ?Neuro: no headache, numbness, tingling, or weakness of extremities ?Musculoskeletal: no joint pain or swelling ?Heme: no bleeding, DVT, or easy bruising ?Endo: no polydipsia or polyuria ? ?BP 140/62   Pulse 69   Ht 5' 9" (1.753 m)   Wt 197 lb 6.4 oz (89.5 kg)   SpO2 98%   BMI 29.15 kg/m?  ? ?PHYSICAL EXAM: ?GEN:  AO x 3 in no acute distress ?HEENT: normal ?Dentition: Normal ?Neck: JVP normal. +2 carotid upstrokes without bruits. No thyromegaly. ?Lungs: equal expansion, clear bilaterally ?CV: Apex is discrete and nondisplaced, RRR with 4/6 SEM ?Abd: soft, non-tender, non-distended; no bruit; positive bowel sounds ?Ext: no edema, ecchymoses, or cyanosis ?Vascular: 2+ femoral pulses, 2+ radial pulses       ?Skin: warm and dry without rash ?Neuro: CN II-XII grossly intact; motor and sensory grossly intact ? ? ? ?DATA AND STUDIES: ? ?EKG: Sinus rhythm ? ?2D ECHO: January 2023 severe aortic stenosis detailed above ? ?CARDIAC CATH:  Pending ? ?STS RISK CALCULATOR: Pending ? ?NHYA CLASS: 1 ? ? ? ?ASSESSMENT AND PLAN:  ? ?Nonrheumatic aortic valve stenosis ? ?Graves' ophthalmopathy ? ?I reviewed the patient's echocardiogram which demonstrates a seve

## 2021-04-29 NOTE — Telephone Encounter (Signed)
? ?  Patient Name: Frank Mckee  ?DOB: 1950/04/01 ?MRN: [id number] ? ?Primary Cardiologist: Ida Rogue, MD ? ?Chart reviewed as part of pre-operative protocol coverage. I reviewed case with Dr. Rockey Situ via secured chat. He feels patient may proceed with sinus surgery as requested. Although not explicitly requested on intake to hold, if the patient needs to hold aspirin for surgery, he may do so from Dr. Donivan Scull standpoint.  ? ?I placed a call to the patient to relay this information as well as the aspirin recommendation but got VM. I left a message for him to call back. [facility] Team: When patient calls back, please relay that Dr. Rockey Situ cleared him for surgery and that Dr. Rockey Situ said OK to hold aspirin if he had received instructions from surgeon to hold this. Otherwise we will defer to surgical team to relay final procedural instructions. ? ?Will route this bundled recommendation to requesting provider via Epic fax function. Please call with questions. ? ? ?Charlie Pitter, PA-C ?04/29/2021, 5:31 PM ? ? ?

## 2021-04-29 NOTE — Telephone Encounter (Signed)
? ?  Pre-operative Risk Assessment  ?  ?Patient Name: Frank Mckee  ?DOB: [birth year] ?MRN: [id number]  ? ?  ? ?Request for Surgical Clearance   ? ?Procedure:   nasal / sinus endoscopy - bilateral  ? ?Date of Surgery:  Clearance 05/01/21                              ?   ?Surgeon:  Fonnie Jarvis, NP ?Surgeon's Group or Practice Name:  [facility] ?Phone number:  [phone] ?Fax number:  [phone] ?  ?Type of Clearance Requested:   ?- Medical  ?  ?Type of Anesthesia:  General  ?  ?Additional requests/questions:  Please advise surgeon/provider what medications should be held. ? ?Signed, ?Caryl Pina Gerringer   ?04/29/2021, 3:22 PM  ? ?

## 2021-04-29 NOTE — Progress Notes (Addendum)
? ? ?Patient ID: ?Frank Mckee ?MRN: [id number] ?DOB/AGE: 1950/03/24 71 y.o. ? ?Primary Care Physician:Feldpausch, Chrissie Noa, MD ?Primary Cardiologist: Ida Rogue, MD ? ? ?FOCUSED CARDIOVASCULAR PROBLEM LIST:   ?1.  Severe aortic stenosis with aortic valve area of 0.9 cm?, mean gradient 51 mmHg and a peak velocity of 4 m/s with normal ejection fraction; no conduction abnormalities on EKG ?2.  Type 1 diabetes on insulin pump ?3.  Hyperlipidemia ?4.  Hypertension ?5.  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 Sharmaine Base ([facility]) ? ? ?HISTORY OF PRESENT ILLNESS: ?The patient is a 71 y.o. male with the indicated medical history here for recommendations regarding his aortic valvular disease.  Patient had been seen by Dr. Rockey Situ recently.  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  He requires nasal endoscopy under general anesthesia.  Because of these clinical considerations he is referred for recommendations regarding his severe aortic stenosis. ? 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  He was seen at [facility] and ultimately started on a high-dose prednisone taper.  His vision and periorbital swelling have improved and are stable.  He is scheduled for surgical decompression with Dr. Posey Pronto tomorrow under general anesthesia. ? ?In terms of symptoms the patient tells me as he told Dr. Rockey Situ that he is relatively asymptomatic.  He is able to do everything he needs to do in his day.  He is very active.  He hunts, uses his tractor, and fixes his roof without any issues.  He has required no emergency room visits or hospitalizations.  He denies any exertional angina or exertional dyspnea.  He has had no  presyncope or syncope.  He denies any orthopnea or paroxysmal nocturnal dyspnea.  He is otherwise well except for his ophthalmologic disorder. ? ?Of note the patient sees a dentist and has regular cleanings and reports good dental health. ? ?Past Medical History:  ?Diagnosis Date  ? Diabetes mellitus without complication ([place])   ? Heart murmur   ? Hypercholesteremia   ? Hypertension   ? Thyroid disease   ?  ?Past Surgical History:  ?Procedure Laterality Date  ? arm surgery    ? BACK SURGERY    ? COLONOSCOPY WITH PROPOFOL N/A 02/17/2017  ? Procedure: COLONOSCOPY WITH PROPOFOL;  Surgeon: Toledo, Benay Pike, MD;  Location: [facility];  Service: Gastroenterology;  Laterality: N/A;  ? ENDOSCOPIC CONCHA BULLOSA RESECTION Right 05/30/2020  ? Procedure: ENDOSCOPIC CONCHA BULLOSA RESECTION;  Surgeon: Margaretha Sheffield, MD;  Location: [place];  Service: ENT;  Laterality: Right;  ? EXCISION NASAL MASS Left 05/30/2020  ? Procedure: EXCISION NASAL MASS;  Surgeon: Margaretha Sheffield, MD;  Location: [place];  Service: ENT;  Laterality: Left;  ? EYE SURGERY    ? cataract extraction  ? NASAL SEPTOPLASTY W/ TURBINOPLASTY N/A 05/30/2020  ? Procedure: NASAL SEPTOPLASTY WITH TURBINATE REDUCTION;  Surgeon: Margaretha Sheffield, MD;  Location: [place];  Service: ENT;  Laterality: N/A;  ? SHOULDER SURGERY    ? TEE WITHOUT CARDIOVERSION N/A 03/01/2020  ? Procedure: TRANSESOPHAGEAL ECHOCARDIOGRAM (TEE);  Surgeon: Minna Merritts, MD;  Location: [facility];  Service: Cardiovascular;  Laterality: N/A;  ?  ?Family History  ?Problem Relation Age of Onset  ?  Healthy Mother   ? Diabetes Father   ? Hypertension Father   ?  ?Social History  ? ?Socioeconomic History  ? Marital status: Married  ?  Spouse name: Not on file  ? Number of children: Not on file  ? Years of education: Not on file  ? Highest education level: Not on file  ?Occupational History  ? Not on file  ?Tobacco Use  ? Smoking status: Never  ? Smokeless tobacco: Never   ?Vaping Use  ? Vaping Use: Never used  ?Substance and Sexual Activity  ? Alcohol use: No  ? Drug use: No  ? Sexual activity: Not on file  ?Other Topics Concern  ? Not on file  ?Social History Narrative  ? Not on file  ? ?Social Determinants of Health  ? ?Financial Resource Strain: Not on file  ?Food Insecurity: Not on file  ?Transportation Needs: Not on file  ?Physical Activity: Not on file  ?Stress: Not on file  ?Social Connections: Not on file  ?Intimate Partner Violence: Not on file  ?  ? ?Prior to Admission medications   ?Medication Sig Start Date End Date Taking? Authorizing Provider  ?aspirin 325 MG tablet Take 325 mg by mouth daily. 04/04/21 07/03/21  [provider]  ?CALCIUM-VITAMIN D PO Take 1,200 mg by mouth daily at 12 noon. 25 mg vit D ?Patient not taking: Reported on 04/21/2021    [provider]  ?cephALEXin (KEFLEX) 500 MG capsule Take 1 capsule (500 mg total) by mouth 2 (two) times daily. ?Patient not taking: Reported on 04/21/2021 05/30/20   Margaretha Sheffield, MD  ?EPINEPHrine (EPIPEN 2-PAK) 0.3 mg/0.3 mL IJ SOAJ injection Inject 0.3 mg into the muscle as needed for anaphylaxis. ?Patient not taking: Reported on 04/21/2021 10/17/19   Merlyn Lot, MD  ?ibuprofen (ADVIL) 200 MG tablet Take 800 mg by mouth daily.    [provider]  ?insulin aspart (NOVOLOG) 100 UNIT/ML injection INJECT UP TO 60 UNITS UNDER THE SKIN VIA PUMP AS DIRECTED    [provider]  ?Insulin Disposable Pump (OMNIPOD 5 G6 INTRO, GEN 5,) KIT Inject into the skin. 02/07/21   [provider]  ?losartan (COZAAR) 50 MG tablet TAKE 1 TABLET(50 MG) BY MOUTH DAILY 04/21/21   Minna Merritts, MD  ?methimazole (TAPAZOLE) 10 MG tablet Take 15 mg by mouth daily. 05/17/19   [provider]  ?Multiple Vitamin (MULTIVITAMIN) tablet Take 1 tablet by mouth daily.    [provider]  ?Pitavastatin Calcium 1 MG TABS Take 1 mg by mouth daily. 04/08/21   [provider]  ?predniSONE  (DELTASONE) 10 MG tablet Start with 3 pills tomorrow. Taper over the next 6 days.  3,3,2,2,1,1. 05/30/20   Margaretha Sheffield, MD  ?tamsulosin (FLOMAX) 0.4 MG CAPS capsule Take 0.4 mg by mouth daily. 02/18/19   [provider]  ?Teprotumumab-trbw (TEPEZZA) 500 MG SOLR Inject into the vein See admin instructions. Every three weeks ?Patient not taking: Reported on 04/21/2021    [provider]  ?triamcinolone cream (KENALOG) 0.1 % Apply 1 application topically 2 (two) times a week. 09/14/16   [provider]  ? ? ?Allergies  ?Allergen Reactions  ? Atorvastatin Other (See Comments)  ?  Muscle cramp  ? Ezetimibe Other (See Comments)  ? ? ?REVIEW OF SYSTEMS:  ?General: no fevers/chills/night sweats ?Eyes: blurry vision, diplopia, or amaurosis ?ENT: no sore throat or hearing loss ?Resp: no cough, wheezing, or hemoptysis ?CV: no edema or palpitations ?GI: no abdominal  pain, nausea, vomiting, diarrhea, or constipation ?GU: no dysuria, frequency, or hematuria ?Skin: no rash ?Neuro: no headache, numbness, tingling, or weakness of extremities ?Musculoskeletal: no joint pain or swelling ?Heme: no bleeding, DVT, or easy bruising ?Endo: no polydipsia or polyuria ? ?BP 140/62   Pulse 69   Ht $R'5\' 9"'tT$  (1.753 m)   Wt 197 lb 6.4 oz (89.5 kg)   SpO2 98%   BMI 29.15 kg/m?  ? ?PHYSICAL EXAM: ?GEN:  AO x 3 in no acute distress ?HEENT: normal ?Dentition: Normal ?Neck: JVP normal. +2 carotid upstrokes without bruits. No thyromegaly. ?Lungs: equal expansion, clear bilaterally ?CV: Apex is discrete and nondisplaced, RRR with 4/6 SEM ?Abd: soft, non-tender, non-distended; no bruit; positive bowel sounds ?Ext: no edema, ecchymoses, or cyanosis ?Vascular: 2+ femoral pulses, 2+ radial pulses       ?Skin: warm and dry without rash ?Neuro: CN II-XII grossly intact; motor and sensory grossly intact ? ? ? ?DATA AND STUDIES: ? ?EKG: Sinus rhythm ? ?2D ECHO: January 2023 severe aortic stenosis detailed above ? ?CARDIAC CATH:  Pending ? ?STS RISK CALCULATOR:  ?Procedure: Isolated AVR ?Risk of Mortality: ?1.365% ?Renal Failure: ?2.198% ?Permanent Stroke: ?0.891% ?Prolonged Ventilation: ?4.299% ?DSW Infection: ?0.144% ?Reoperation: ?3.257% ?Mo

## 2021-04-30 ENCOUNTER — Other Ambulatory Visit: Payer: Self-pay | Admitting: Physician Assistant

## 2021-04-30 ENCOUNTER — Other Ambulatory Visit: Payer: Self-pay

## 2021-04-30 ENCOUNTER — Ambulatory Visit (INDEPENDENT_AMBULATORY_CARE_PROVIDER_SITE_OTHER): Payer: Medicare Other | Admitting: Internal Medicine

## 2021-04-30 ENCOUNTER — Other Ambulatory Visit: Payer: Self-pay | Admitting: Internal Medicine

## 2021-04-30 ENCOUNTER — Encounter: Payer: Self-pay | Admitting: Internal Medicine

## 2021-04-30 VITALS — BP 140/62 | HR 69 | Ht 69.0 in | Wt 197.4 lb

## 2021-04-30 DIAGNOSIS — I350 Nonrheumatic aortic (valve) stenosis: Secondary | ICD-10-CM

## 2021-04-30 DIAGNOSIS — E0500 Thyrotoxicosis with diffuse goiter without thyrotoxic crisis or storm: Secondary | ICD-10-CM | POA: Diagnosis not present

## 2021-04-30 MED ORDER — SODIUM CHLORIDE 0.9% FLUSH: 3.0000 mL | Freq: Two times a day (BID) | INTRAVENOUS | Status: DC

## 2021-04-30 NOTE — Patient Instructions (Signed)
Medication Instructions:  ?No changes ? ?*If you need a refill on your cardiac medications before your next appointment, please call your pharmacy* ? ? ?Lab Work: ?none ? ? ?Testing/Procedures: ?Your physician has requested that you have a cardiac catheterization. Cardiac catheterization is used to diagnose and/or treat various heart conditions. Doctors may recommend this procedure for a number of different reasons. The most common reason is to evaluate chest pain. Chest pain can be a symptom of coronary artery disease (CAD), and cardiac catheterization can show whether plaque is narrowing or blocking your heart?s arteries. This procedure is also used to evaluate the valves, as well as measure the blood flow and oxygen levels in different parts of your heart. For further information please visit [email or web address]. Please follow instruction sheet, as given. ? ? ?Follow-Up: ?Per Structural Heart Valve Team ? ? ?Other Instructions ?Marland Kitchen ?[place] ?[facility] OFF[facility] ?[address], [address] ?[place] [address] ?Dept: [phone] ?Loc: [phone] ? ?Frank Mckee  04/30/2021 ? ?You are scheduled for a Cardiac Catheterization on Thursday, March 30 with Dr. Lenna Sciara. ? ?1. Please arrive at the Main Entrance A at [facility]: [place] [address] at 8:30 AM (This time is two hours before your procedure to ensure your preparation). Free valet parking service is available.  ? ?Special note: Every effort is made to have your procedure done on time. Please understand that emergencies sometimes delay scheduled procedures. ? ?2. Diet: Do not eat solid foods after midnight.  You may have clear liquids until 5 AM upon the day of the procedure. ? ?3. Labs: None needed ? ?4. Medication instructions in preparation for your procedure: ? ? Contrast Allergy: No ? ?Place insulin pump to basal ? ?On the morning of your procedure,  take Aspirin 81 mg and any morning medicines NOT listed above.  You may use sips of water. ? ?5. Plan to go home the same day, you will only stay overnight if medically necessary. ?6. You MUST have a responsible adult to drive you home. ?7. An adult MUST be with you the first 24 hours after you arrive home. ?8. Bring a current list of your medications, and the last time and date medication taken. ?9. Bring ID and current insurance cards. ?10.Please wear clothes that are easy to get on and off and wear slip-on shoes. ? ?Thank you for allowing [place] to care for you! ?  -- [facility] ?  ?

## 2021-05-01 ENCOUNTER — Encounter: Payer: Self-pay | Admitting: Physician Assistant

## 2021-05-01 ENCOUNTER — Other Ambulatory Visit: Payer: Self-pay

## 2021-05-01 ENCOUNTER — Encounter (HOSPITAL_COMMUNITY)
Admission: RE | Disposition: A | Discharge status: Home / Self Care | Payer: Self-pay | Source: Home / Self Care | Attending: Internal Medicine

## 2021-05-01 ENCOUNTER — Ambulatory Visit (HOSPITAL_COMMUNITY)
Admission: RE | Admit: 2021-05-01 | Discharge: 2021-05-01 | Disposition: A | Discharge status: Home / Self Care | Payer: Medicare Other | Attending: Internal Medicine | Admitting: Internal Medicine

## 2021-05-01 DIAGNOSIS — E109 Type 1 diabetes mellitus without complications: Secondary | ICD-10-CM | POA: Insufficient documentation

## 2021-05-01 DIAGNOSIS — I350 Nonrheumatic aortic (valve) stenosis: Secondary | ICD-10-CM | POA: Diagnosis present

## 2021-05-01 DIAGNOSIS — Z79899 Other long term (current) drug therapy: Secondary | ICD-10-CM | POA: Diagnosis not present

## 2021-05-01 DIAGNOSIS — I10 Essential (primary) hypertension: Secondary | ICD-10-CM | POA: Insufficient documentation

## 2021-05-01 DIAGNOSIS — E0500 Thyrotoxicosis with diffuse goiter without thyrotoxic crisis or storm: Secondary | ICD-10-CM | POA: Insufficient documentation

## 2021-05-01 DIAGNOSIS — Z7982 Long term (current) use of aspirin: Secondary | ICD-10-CM | POA: Insufficient documentation

## 2021-05-01 DIAGNOSIS — Z794 Long term (current) use of insulin: Secondary | ICD-10-CM | POA: Diagnosis not present

## 2021-05-01 DIAGNOSIS — Z9641 Presence of insulin pump (external) (internal): Secondary | ICD-10-CM | POA: Insufficient documentation

## 2021-05-01 DIAGNOSIS — E785 Hyperlipidemia, unspecified: Secondary | ICD-10-CM | POA: Diagnosis not present

## 2021-05-01 HISTORY — PX: RIGHT/LEFT HEART CATH AND CORONARY ANGIOGRAPHY: CATH118266

## 2021-05-01 HISTORY — PX: ABDOMINAL AORTOGRAM: CATH118222

## 2021-05-01 LAB — POCT I-STAT 7, (LYTES, BLD GAS, ICA,H+H)
Acid-Base Excess: 0 mmol/L (ref 0.0–2.0)
Acid-Base Excess: 2 mmol/L (ref 0.0–2.0)
Bicarbonate: 25.8 mmol/L (ref 20.0–28.0)
Bicarbonate: 26.4 mmol/L (ref 20.0–28.0)
Calcium, Ion: 1.16 mmol/L (ref 1.15–1.40)
Calcium, Ion: 1.19 mmol/L (ref 1.15–1.40)
HCT: 39 % (ref 39.0–52.0)
HCT: 38 % — ABNORMAL LOW (ref 39.0–52.0)
Hemoglobin: 13.3 g/dL (ref 13.0–17.0)
Hemoglobin: 12.9 g/dL — ABNORMAL LOW (ref 13.0–17.0)
O2 Saturation: 96 %
O2 Saturation: 97 %
Potassium: 3.8 mmol/L (ref 3.5–5.1)
Potassium: 3.8 mmol/L (ref 3.5–5.1)
Sodium: 141 mmol/L (ref 135–145)
Sodium: 140 mmol/L (ref 135–145)
TCO2: 27 mmol/L (ref 22–32)
TCO2: 28 mmol/L (ref 22–32)
pCO2 arterial: 43.5 mmHg (ref 32–48)
pCO2 arterial: 38 mmHg (ref 32–48)
pH, Arterial: 7.381 (ref 7.35–7.45)
pH, Arterial: 7.45 (ref 7.35–7.45)
pO2, Arterial: 85 mmHg (ref 83–108)
pO2, Arterial: 88 mmHg (ref 83–108)

## 2021-05-01 LAB — POCT I-STAT EG7
Acid-Base Excess: 0 mmol/L (ref 0.0–2.0)
Acid-Base Excess: 1 mmol/L (ref 0.0–2.0)
Acid-Base Excess: 1 mmol/L (ref 0.0–2.0)
Acid-Base Excess: 1 mmol/L (ref 0.0–2.0)
Acid-Base Excess: 3 mmol/L — ABNORMAL HIGH (ref 0.0–2.0)
Bicarbonate: 25.7 mmol/L (ref 20.0–28.0)
Bicarbonate: 27 mmol/L (ref 20.0–28.0)
Bicarbonate: 25.4 mmol/L (ref 20.0–28.0)
Bicarbonate: 25.8 mmol/L (ref 20.0–28.0)
Bicarbonate: 27.6 mmol/L (ref 20.0–28.0)
Calcium, Ion: 1.11 mmol/L — ABNORMAL LOW (ref 1.15–1.40)
Calcium, Ion: 1.25 mmol/L (ref 1.15–1.40)
Calcium, Ion: 1.1 mmol/L — ABNORMAL LOW (ref 1.15–1.40)
Calcium, Ion: 1.2 mmol/L (ref 1.15–1.40)
Calcium, Ion: 1.22 mmol/L (ref 1.15–1.40)
HCT: 39 % (ref 39.0–52.0)
HCT: 40 % (ref 39.0–52.0)
HCT: 37 % — ABNORMAL LOW (ref 39.0–52.0)
HCT: 37 % — ABNORMAL LOW (ref 39.0–52.0)
HCT: 38 % — ABNORMAL LOW (ref 39.0–52.0)
Hemoglobin: 13.3 g/dL (ref 13.0–17.0)
Hemoglobin: 13.6 g/dL (ref 13.0–17.0)
Hemoglobin: 12.6 g/dL — ABNORMAL LOW (ref 13.0–17.0)
Hemoglobin: 12.6 g/dL — ABNORMAL LOW (ref 13.0–17.0)
Hemoglobin: 12.9 g/dL — ABNORMAL LOW (ref 13.0–17.0)
O2 Saturation: 79 %
O2 Saturation: 81 %
O2 Saturation: 73 %
O2 Saturation: 75 %
O2 Saturation: 76 %
Potassium: 3.6 mmol/L (ref 3.5–5.1)
Potassium: 3.9 mmol/L (ref 3.5–5.1)
Potassium: 3.5 mmol/L (ref 3.5–5.1)
Potassium: 3.8 mmol/L (ref 3.5–5.1)
Potassium: 3.8 mmol/L (ref 3.5–5.1)
Sodium: 141 mmol/L (ref 135–145)
Sodium: 143 mmol/L (ref 135–145)
Sodium: 139 mmol/L (ref 135–145)
Sodium: 139 mmol/L (ref 135–145)
Sodium: 141 mmol/L (ref 135–145)
TCO2: 27 mmol/L (ref 22–32)
TCO2: 28 mmol/L (ref 22–32)
TCO2: 27 mmol/L (ref 22–32)
TCO2: 27 mmol/L (ref 22–32)
TCO2: 29 mmol/L (ref 22–32)
pCO2, Ven: 43.8 mmHg — ABNORMAL LOW (ref 44–60)
pCO2, Ven: 46.4 mmHg (ref 44–60)
pCO2, Ven: 40.9 mmHg — ABNORMAL LOW (ref 44–60)
pCO2, Ven: 42 mmHg — ABNORMAL LOW (ref 44–60)
pCO2, Ven: 43.3 mmHg — ABNORMAL LOW (ref 44–60)
pH, Ven: 7.373 (ref 7.25–7.43)
pH, Ven: 7.377 (ref 7.25–7.43)
pH, Ven: 7.397 (ref 7.25–7.43)
pH, Ven: 7.401 (ref 7.25–7.43)
pH, Ven: 7.413 (ref 7.25–7.43)
pO2, Ven: 45 mmHg (ref 32–45)
pO2, Ven: 47 mmHg — ABNORMAL HIGH (ref 32–45)
pO2, Ven: 38 mmHg (ref 32–45)
pO2, Ven: 40 mmHg (ref 32–45)
pO2, Ven: 40 mmHg (ref 32–45)

## 2021-05-01 LAB — BASIC METABOLIC PANEL
Anion gap: 9 (ref 5–15)
BUN: 27 mg/dL — ABNORMAL HIGH (ref 8–23)
CO2: 23 mmol/L (ref 22–32)
Calcium: 8.8 mg/dL — ABNORMAL LOW (ref 8.9–10.3)
Chloride: 107 mmol/L (ref 98–111)
Creatinine, Ser: 0.96 mg/dL (ref 0.61–1.24)
GFR, Estimated: >60 mL/min (ref >60)
Glucose, Bld: 101 mg/dL — ABNORMAL HIGH (ref 70–99)
Potassium: 4.2 mmol/L (ref 3.5–5.1)
Sodium: 139 mmol/L (ref 135–145)

## 2021-05-01 LAB — GLUCOSE, CAPILLARY: Glucose-Capillary: 112 mg/dL — ABNORMAL HIGH (ref 70–99)

## 2021-05-01 SURGERY — RIGHT/LEFT HEART CATH AND CORONARY ANGIOGRAPHY
Anesthesia: LOCAL

## 2021-05-01 MED ORDER — SODIUM CHLORIDE 0.9 % IV SOLN: INTRAVENOUS | Status: DC

## 2021-05-01 MED ORDER — VERAPAMIL HCL 2.5 MG/ML IV SOLN
INTRAVENOUS | Status: AC
  Filled 2021-05-01: qty 2

## 2021-05-01 MED ORDER — LIDOCAINE HCL (PF) 1 % IJ SOLN
INTRAMUSCULAR | Status: DC | PRN
  Administered 2021-05-01: 2 mL via INTRADERMAL

## 2021-05-01 MED ORDER — IOHEXOL 350 MG/ML SOLN
INTRAVENOUS | Status: DC | PRN
  Administered 2021-05-01: 90 mL

## 2021-05-01 MED ORDER — HEPARIN (PORCINE) IN NACL 1000-0.9 UT/500ML-% IV SOLN
INTRAVENOUS | Status: DC | PRN
  Administered 2021-05-01 (×2): 500 mL

## 2021-05-01 MED ORDER — HEPARIN SODIUM (PORCINE) 1000 UNIT/ML IJ SOLN
INTRAMUSCULAR | Status: AC
  Filled 2021-05-01: qty 10

## 2021-05-01 MED ORDER — MIDAZOLAM HCL 2 MG/2ML IJ SOLN
INTRAMUSCULAR | Status: AC
  Filled 2021-05-01: qty 2

## 2021-05-01 MED ORDER — SODIUM CHLORIDE 0.9% FLUSH: 3.0000 mL | INTRAVENOUS | Status: DC | PRN

## 2021-05-01 MED ORDER — SODIUM CHLORIDE 0.9 % IV SOLN: 250.0000 mL | INTRAVENOUS | Status: DC | PRN

## 2021-05-01 MED ORDER — FENTANYL CITRATE (PF) 100 MCG/2ML IJ SOLN
INTRAMUSCULAR | Status: AC
Start: 2021-05-01 — End: ?
  Filled 2021-05-01: qty 2

## 2021-05-01 MED ORDER — LIDOCAINE HCL (PF) 1 % IJ SOLN
INTRAMUSCULAR | Status: AC
  Filled 2021-05-01: qty 30

## 2021-05-01 MED ORDER — ACETAMINOPHEN 325 MG PO TABS: 650.0000 mg | ORAL_TABLET | ORAL | Status: DC | PRN

## 2021-05-01 MED ORDER — HYDRALAZINE HCL 20 MG/ML IJ SOLN: 10.0000 mg | INTRAMUSCULAR | Status: DC | PRN

## 2021-05-01 MED ORDER — HEPARIN (PORCINE) IN NACL 1000-0.9 UT/500ML-% IV SOLN
INTRAVENOUS | Status: AC
  Filled 2021-05-01: qty 1000

## 2021-05-01 MED ORDER — SODIUM CHLORIDE 0.9% FLUSH: 3.0000 mL | Freq: Two times a day (BID) | INTRAVENOUS | Status: DC

## 2021-05-01 MED ORDER — LABETALOL HCL 5 MG/ML IV SOLN: 10.0000 mg | INTRAVENOUS | Status: DC | PRN

## 2021-05-01 MED ORDER — HEPARIN SODIUM (PORCINE) 1000 UNIT/ML IJ SOLN
INTRAMUSCULAR | Status: DC | PRN
  Administered 2021-05-01: 5000 [IU] via INTRAVENOUS

## 2021-05-01 MED ORDER — ONDANSETRON HCL 4 MG/2ML IJ SOLN: 4.0000 mg | Freq: Four times a day (QID) | INTRAMUSCULAR | Status: DC | PRN

## 2021-05-01 MED ORDER — SODIUM CHLORIDE 0.9% FLUSH
3.0000 mL | INTRAVENOUS | Status: DC | PRN
Start: 2021-05-01 — End: 2021-05-01

## 2021-05-01 MED ORDER — VERAPAMIL HCL 2.5 MG/ML IV SOLN
INTRAVENOUS | Status: DC | PRN
  Administered 2021-05-01: 10 mL via INTRA_ARTERIAL

## 2021-05-01 MED ORDER — ASPIRIN 81 MG PO CHEW: 81.0000 mg | CHEWABLE_TABLET | ORAL | Status: DC

## 2021-05-01 MED ORDER — MIDAZOLAM HCL 2 MG/2ML IJ SOLN
INTRAMUSCULAR | Status: DC | PRN
  Administered 2021-05-01: 1 mg via INTRAVENOUS

## 2021-05-01 MED ORDER — FENTANYL CITRATE (PF) 100 MCG/2ML IJ SOLN
INTRAMUSCULAR | Status: DC | PRN
  Administered 2021-05-01: 25 ug via INTRAVENOUS

## 2021-05-01 SURGICAL SUPPLY — 20 items
BAG SNAP BAND KOVER 36X36 (MISCELLANEOUS) ×1 IMPLANT
BAND ZEPHYR COMPRESS 30 LONG (HEMOSTASIS) ×1 IMPLANT
CATH BALLN WEDGE 5F 110CM (CATHETERS) ×1 IMPLANT
CATH DIAG 6FR JR4 (CATHETERS) ×1 IMPLANT
CATH DIAG 6FR PIGTAIL ANGLED (CATHETERS) ×1 IMPLANT
CATH INFINITI 5 FR 3DRC (CATHETERS) ×1 IMPLANT
CATH INFINITI 5FR AL1 (CATHETERS) ×1 IMPLANT
CATH JL3.5 FR DIAG (CATHETERS) ×1 IMPLANT
GLIDESHEATH SLEND SS 6F .021 (SHEATH) ×1 IMPLANT
GUIDEWIRE .025 260CM (WIRE) ×1 IMPLANT
GUIDEWIRE INQWIRE 1.5J.035X260 (WIRE) IMPLANT
INQWIRE 1.5J .035X260CM (WIRE) ×2
KIT HEART LEFT (KITS) ×2 IMPLANT
PACK CARDIAC CATHETERIZATION (CUSTOM PROCEDURE TRAY) ×2 IMPLANT
SHEATH GLIDE SLENDER 4/5FR (SHEATH) ×1 IMPLANT
SYR MEDRAD MARK 7 150ML (SYRINGE) ×2 IMPLANT
TRANSDUCER W/STOPCOCK (MISCELLANEOUS) ×2 IMPLANT
TUBING CIL FLEX 10 FLL-RA (TUBING) ×2 IMPLANT
TUBING CONTRAST HIGH PRESS 48 (TUBING) ×1 IMPLANT
WIRE EMERALD 3MM-J .025X260CM (WIRE) IMPLANT

## 2021-05-01 NOTE — Discharge Instructions (Signed)

## 2021-05-01 NOTE — Interval H&P Note (Signed)
History and Physical Interval Note: ? ?05/01/2021 ?9:08 AM ? ?Frank Mckee  has presented today for surgery, with the diagnosis of aortic stenosis.  The various methods of treatment have been discussed with the patient and family. After consideration of risks, benefits and other options for treatment, the patient has consented to  Procedure(s): ?RIGHT/LEFT HEART CATH AND CORONARY ANGIOGRAPHY (N/A) as a surgical intervention.  The patient's history has been reviewed, patient examined, no change in status, stable for surgery.  I have reviewed the patient's chart and labs.  Questions were answered to the patient's satisfaction.   ? ?Cath Lab Visit (complete for each Cath Lab visit) ? ?Clinical Evaluation Leading to the Procedure:  ? ?ACS: No. ? ?Non-ACS:   ? ?Anginal Classification: No Symptoms ? ?Anti-ischemic medical therapy: Minimal Therapy (1 class of medications) ? ?Non-Invasive Test Results: No non-invasive testing performed ? ?Prior CABG: No previous CABG ? ? ? ? ? ? ? ?Frank Mckee ? ? ?

## 2021-05-02 ENCOUNTER — Encounter (HOSPITAL_COMMUNITY): Payer: Self-pay | Admitting: Internal Medicine

## 2021-05-20 ENCOUNTER — Ambulatory Visit (HOSPITAL_COMMUNITY)
Admission: RE | Admit: 2021-05-20 | Discharge: 2021-05-20 | Disposition: A | Discharge status: Home / Self Care | Payer: Medicare Other | Source: Ambulatory Visit | Attending: Physician Assistant | Admitting: Physician Assistant

## 2021-05-20 DIAGNOSIS — I350 Nonrheumatic aortic (valve) stenosis: Secondary | ICD-10-CM | POA: Diagnosis present

## 2021-05-20 MED ORDER — IOHEXOL 350 MG/ML SOLN
100.0000 mL | Freq: Once | INTRAVENOUS | Status: AC | PRN
  Administered 2021-05-20: 100 mL via INTRAVENOUS

## 2021-05-21 ENCOUNTER — Institutional Professional Consult (permissible substitution) (INDEPENDENT_AMBULATORY_CARE_PROVIDER_SITE_OTHER): Payer: Medicare Other | Admitting: Surgery

## 2021-05-21 ENCOUNTER — Other Ambulatory Visit: Payer: Self-pay

## 2021-05-21 ENCOUNTER — Encounter: Payer: Self-pay | Admitting: Surgery

## 2021-05-21 VITALS — BP 149/80 | HR 68 | Resp 20 | Ht 69.0 in | Wt 190.0 lb

## 2021-05-21 DIAGNOSIS — I350 Nonrheumatic aortic (valve) stenosis: Secondary | ICD-10-CM

## 2021-05-21 NOTE — Progress Notes (Signed)
Pre Surgical Assessment: 5 M Walk Test ? ?66M=16.74f ? ?5 Meter Walk Test- trial 1: 5.27 seconds ?5 Meter Walk Test- trial 2: 5.14 seconds ?5 Meter Walk Test- trial 3: 4.57 seconds ?5 Meter Walk Test Average: 4.99 seconds ?  ?

## 2021-05-21 NOTE — Progress Notes (Addendum)
Patient ID: Frank Mckee, male   DOB: 1950-11-22, 71 y.o.   MRN: [id number] ? [facility]   ?MULTIDISCIPLIN[facility] VALVE CLINIC ?  ? ? ? ?   ?[address] ?      [place] [address] ?            [phone]   ?      ? ?CARDIOTHORACIC SURGERY CONSULTATION REPORT ? ?PCP is Feldpausch, Chrissie Noa, MD ?Referring Provider is Lenna Sciara, MD ?Primary Cardiologist is Ida Rogue, MD ? ?Reason for consultation:  Severe aortic stenosis ? ?HPI: ? ?The patient is a 71 year old gentleman with history of hypertension, hypercholesterolemia, type 2 diabetes, and Graves' disease with ophthalmopathy followed by Dr. Sharmaine Base at [facility].  He is a very active gentleman who continues to work daily doing heavy physical activities including heavy lifting without any symptoms.  He denies any shortness of breath or fatigue.  He denies chest discomfort.  He denies dizziness and syncope.  He has no orthopnea or peripheral edema. ? ?His ophthalmopathy from Graves' disease recently recurred and he has been treated with prednisone.  It was felt that he may require endoscopic surgical decompression under general anesthesia.  He has known severe aortic stenosis by echocardiogram most recently on 02/11/2021.  This showed a mean gradient of 42.5 mmHg and a valve area of 0.98 cm?.  Left ventricular ejection fraction was 60 to 65%.  The plan was to continue following his aortic stenosis since he was completely asymptomatic but now since he may require general anesthesia it was felt that he should be evaluated for TAVR to safely allow him to undergo general anesthesia.  He underwent cardiac catheterization on 05/01/2021 showing severe aortic stenosis with a peak to peak gradient of 80 mmHg.  Filling pressures and cardiac output were normal.  Coronary arteries were normal. ? ?He recently started on intermittent infusions of Tepezza every 3 weeks for his ophthalmopathy which has significantly improved it and he may not  require surgical decompression. ? ?Past Medical History:  ?Diagnosis Date  ? Diabetes mellitus without complication ([place])   ? Heart murmur   ? Hypercholesteremia   ? Hypertension   ? Thyroid disease   ? ? ?Past Surgical History:  ?Procedure Laterality Date  ? ABDOMINAL AORTOGRAM N/A 05/01/2021  ? Procedure: ABDOMINAL AORTOGRAM;  Surgeon: Early Osmond, MD;  Location: [place] [facility];  Service: Cardiovascular;  Laterality: N/A;  ? arm surgery    ? BACK SURGERY    ? COLONOSCOPY WITH PROPOFOL N/A 02/17/2017  ? Procedure: COLONOSCOPY WITH PROPOFOL;  Surgeon: Toledo, Benay Pike, MD;  Location: [facility];  Service: Gastroenterology;  Laterality: N/A;  ? ENDOSCOPIC CONCHA BULLOSA RESECTION Right 05/30/2020  ? Procedure: ENDOSCOPIC CONCHA BULLOSA RESECTION;  Surgeon: Margaretha Sheffield, MD;  Location: [place];  Service: ENT;  Laterality: Right;  ? EXCISION NASAL MASS Left 05/30/2020  ? Procedure: EXCISION NASAL MASS;  Surgeon: Margaretha Sheffield, MD;  Location: [place];  Service: ENT;  Laterality: Left;  ? EYE SURGERY    ? cataract extraction  ? NASAL SEPTOPLASTY W/ TURBINOPLASTY N/A 05/30/2020  ? Procedure: NASAL SEPTOPLASTY WITH TURBINATE REDUCTION;  Surgeon: Margaretha Sheffield, MD;  Location: [place];  Service: ENT;  Laterality: N/A;  ? RIGHT/LEFT HEART CATH AND CORONARY ANGIOGRAPHY N/A 05/01/2021  ? Procedure: RIGHT/LEFT HEART CATH AND CORONARY ANGIOGRAPHY;  Surgeon: Early Osmond, MD;  Location: [place] [facility];  Service: Cardiovascular;  Laterality:  N/A;  ? SHOULDER SURGERY    ? TEE WITHOUT CARDIOVERSION N/A 03/01/2020  ? Procedure: TRANSESOPHAGEAL ECHOCARDIOGRAM (TEE);  Surgeon: Minna Merritts, MD;  Location: [facility];  Service: Cardiovascular;  Laterality: N/A;  ? ? ?Family History  ?Problem Relation Age of Onset  ? Healthy Mother   ? Diabetes Father   ? Hypertension Father   ? ? ?Social History  ? ?Socioeconomic History  ? Marital status: Married  ?  Spouse name: Not on file  ?  Number of children: Not on file  ? Years of education: Not on file  ? Highest education level: Not on file  ?Occupational History  ? Not on file  ?Tobacco Use  ? Smoking status: Never  ? Smokeless tobacco: Never  ?Vaping Use  ? Vaping Use: Never used  ?Substance and Sexual Activity  ? Alcohol use: No  ? Drug use: No  ? Sexual activity: Not on file  ?Other Topics Concern  ? Not on file  ?Social History Narrative  ? Not on file  ? ?Social Determinants of Health  ? ?Financial Resource Strain: Not on file  ?Food Insecurity: Not on file  ?Transportation Needs: Not on file  ?Physical Activity: Not on file  ?Stress: Not on file  ?Social Connections: Not on file  ?Intimate Partner Violence: Not on file  ? ? ?Prior to Admission medications   ?Medication Sig Start Date End Date Taking? Authorizing Provider  ?EPINEPHrine (EPIPEN 2-PAK) 0.3 mg/0.3 mL IJ SOAJ injection Inject 0.3 mg into the muscle as needed for anaphylaxis. 10/17/19  Yes Merlyn Lot, MD  ?fexofenadine (ALLEGRA) 180 MG tablet Take 180 mg by mouth daily.   Yes [provider]  ?insulin aspart (NOVOLOG) 100 UNIT/ML injection INJECT UP TO 60 UNITS UNDER THE SKIN VIA PUMP AS DIRECTED   Yes [provider]  ?Insulin Disposable Pump (OMNIPOD 5 G6 INTRO, GEN 5,) KIT Inject into the skin. 02/07/21  Yes [provider]  ?losartan (COZAAR) 50 MG tablet TAKE 1 TABLET(50 MG) BY MOUTH DAILY 04/21/21  Yes Gollan, Kathlene November, MD  ?methimazole (TAPAZOLE) 10 MG tablet Take 10-15 mg by mouth See admin instructions. Alternate 10 mg and 15 mg every other day 05/17/19  Yes [provider]  ?Multiple Vitamin (MULTIVITAMIN) tablet Take 1 tablet by mouth daily.   Yes [provider]  ?predniSONE (DELTASONE) 10 MG tablet Start with 3 pills tomorrow. Taper over the next 6 days.  3,3,2,2,1,1. 05/30/20  Yes Margaretha Sheffield, MD  ?tamsulosin (FLOMAX) 0.4 MG CAPS capsule Take 0.4 mg by mouth every evening. 02/18/19  Yes [provider]   ?triamcinolone cream (KENALOG) 0.1 % Apply 1 application. topically daily as needed (Rash). 09/14/16  Yes [provider]  ? ? ?Current Outpatient Medications  ?Medication Sig Dispense Refill  ? EPINEPHrine (EPIPEN 2-PAK) 0.3 mg/0.3 mL IJ SOAJ injection Inject 0.3 mg into the muscle as needed for anaphylaxis. 1 each 1  ? fexofenadine (ALLEGRA) 180 MG tablet Take 180 mg by mouth daily.    ? insulin aspart (NOVOLOG) 100 UNIT/ML injection INJECT UP TO 60 UNITS UNDER THE SKIN VIA PUMP AS DIRECTED    ? Insulin Disposable Pump (OMNIPOD 5 G6 INTRO, GEN 5,) KIT Inject into the skin.    ? losartan (COZAAR) 50 MG tablet TAKE 1 TABLET(50 MG) BY MOUTH DAILY 90 tablet 3  ? methimazole (TAPAZOLE) 10 MG tablet Take 10-15 mg by mouth See admin instructions. Alternate 10 mg and 15 mg every other day    ?  Multiple Vitamin (MULTIVITAMIN) tablet Take 1 tablet by mouth daily.    ? predniSONE (DELTASONE) 10 MG tablet Start with 3 pills tomorrow. Taper over the next 6 days.  3,3,2,2,1,1. 12 tablet 0  ? tamsulosin (FLOMAX) 0.4 MG CAPS capsule Take 0.4 mg by mouth every evening.    ? triamcinolone cream (KENALOG) 0.1 % Apply 1 application. topically daily as needed (Rash).    ? ?Current Facility-Administered Medications  ?Medication Dose Route Frequency Provider Last Rate Last Admin  ? sodium chloride flush (NS) 0.9 % injection 3 mL  3 mL Intravenous Q12H Early Osmond, MD      ? ? ?Allergies  ?Allergen Reactions  ? Atorvastatin Other (See Comments)  ?  Muscle cramp  ? Ezetimibe Other (See Comments)  ?  Muscle cramps  ? ? ? ? ?Review of Systems: ? ? General:  normal appetite, normal energy, no weight gain, no weight loss, no fever ? Cardiac:  no chest pain with exertion, no chest pain at rest, no SOB with exertion, no resting SOB, no PND, no orthopnea, no palpitations, no arrhythmia, no atrial fibrillation, no LE edema, no dizzy spells, no syncope ? Respiratory:  no shortness of breath, no home oxygen, no productive cough, no  dry cough, no bronchitis, no wheezing, no hemoptysis, no asthma, no pain with inspiration or cough, no sleep apnea, no CPAP at night ? GI:   no difficulty swallowing, no reflux, no frequent heartburn, no hiatal her

## 2021-05-22 NOTE — Progress Notes (Addendum)
Surgical Instructions ? ? ? Your procedure is scheduled on 05/27/21. ? Report to [facility] Main Entrance "A" at 8:45 A.M., then check in with the Admitting office. ? Call this number if you have problems the morning of surgery: ? [phone] ? ? If you have any questions prior to your surgery date call [phone]: Open Monday-Friday 8am-4pm ? ? ? Remember: ? Do not eat or drink after midnight the night before your surgery ? ? ?  ? Take these medicines the morning of surgery with A SIP OF WATER:   NONE ? ?Continue taking all other medications including Aspirin without change through the day before surgery. ? ?On the morning of surgery, do not take any medications.  ? ?As of today, STOP taking any Aleve, Naproxen, Ibuprofen, Motrin, Advil, Goody's, BC's, all herbal medications, fish oil, and all vitamins. ? ?WHAT DO I DO ABOUT MY DIABETES MEDICATION? ? ?You may contact your primary care physician or endocrinologist or reduce all basal rates of insulin by 20% at midnight. ? ? ?HOW TO MANAGE YOUR DIABETES ?BEFORE AND AFTER SURGERY ? ?Why is it important to control my blood sugar before and after surgery? ?Improving blood sugar levels before and after surgery helps healing and can limit problems. ?A way of improving blood sugar control is eating a healthy diet by: ? Eating less sugar and carbohydrates ? Increasing activity/exercise ? Talking with your doctor about reaching your blood sugar goals ?High blood sugars (greater than 180 mg/dL) can raise your risk of infections and slow your recovery, so you will need to focus on controlling your diabetes during the weeks before surgery. ?Make sure that the doctor who takes care of your diabetes knows about your planned surgery including the date and location. ? ?How do I manage my blood sugar before surgery? ?Check your blood sugar at least 4 times a day, starting 2 days before surgery, to make sure that the level is not too high or low. ? ?Check your blood sugar the  morning of your surgery when you wake up and every 2 hours until you get to the Short Stay unit. ? ?If your blood sugar is less than 70 mg/dL, you will need to treat for low blood sugar: ?Do not take insulin. ?Treat a low blood sugar (less than 70 mg/dL) with ? cup of clear juice (cranberry or apple), 4 glucose tablets, OR glucose gel. ?Recheck blood sugar in 15 minutes after treatment (to make sure it is greater than 70 mg/dL). If your blood sugar is not greater than 70 mg/dL on recheck, call [phone] for further instructions. ?Report your blood sugar to the short stay nurse when you get to Short Stay. ? ?If you are admitted to the hospital after surgery: ?Your blood sugar will be checked by the staff and you will probably be given insulin after surgery (instead of oral diabetes medicines) to make sure you have good blood sugar levels. ?The goal for blood sugar control after surgery is 80-180 mg/dL.  ? ?         ?Do not wear jewelry  ?Do not wear lotions, powders, colognes, or deodorant. ?Do not shave 48 hours prior to surgery.  Men may shave face and neck. ?Do not bring valuables to the hospital. ? ? ?[facility] is not responsible for any belongings or valuables. .  ? ?Do NOT Smoke (Tobacco/Vaping)  24 hours prior to your procedure ? ?If you use a CPAP at night, you may bring your mask for  your overnight stay. ?  ?Contacts, glasses, hearing aids, dentures or partials may not be worn into surgery, please bring cases for these belongings ?  ?For patients admitted to the hospital, discharge time will be determined by your treatment team. ?  ?Patients discharged the day of surgery will not be allowed to drive home, and someone needs to stay with them for 24 hours. ? ? ?SURGICAL WAITING ROOM VISITATION ?Patients having surgery or a procedure in a hospital may have two support people. ?Children under the age of 58 must have an adult with them who is not the patient. ?They may stay in the waiting area during the  procedure and may switch out with other visitors. If the patient needs to stay at the hospital during part of their recovery, the visitor guidelines for inpatient rooms apply. ? ?Please refer to the [facility] website for the visitor guidelines for Inpatients (after your surgery is over and you are in a regular room).  ? ? ? ?Special instructions:   ? ?Oral Hygiene is also important to reduce your risk of infection.  Remember - BRUSH YOUR TEETH THE MORNING OF SURGERY WITH YOUR REGULAR TOOTHPASTE ? ? ?[facility]- Preparing For Surgery ? ?Before surgery, you can play an important role. Because skin is not sterile, your skin needs to be as free of germs as possible. You can reduce the number of germs on your skin by washing with CHG (chlorahexidine gluconate) Soap before surgery.  CHG is an antiseptic cleaner which kills germs and bonds with the skin to continue killing germs even after washing.   ? ? ?Please do not use if you have an allergy to CHG or antibacterial soaps. If your skin becomes reddened/irritated stop using the CHG.  ?Do not shave (including legs and underarms) for at least 48 hours prior to first CHG shower. It is OK to shave your face. ? ?Please follow these instructions carefully. ?  ? ? Shower the NIGHT BEFORE SURGERY and the MORNING OF SURGERY with CHG Soap.  ? If you chose to wash your hair, wash your hair first as usual with your normal shampoo. After you shampoo, rinse your hair and body thoroughly to remove the shampoo.  Then [employer] and genitals (private parts) with your normal soap and rinse thoroughly to remove soap. ? ?After that Use CHG Soap as you would any other liquid soap. You can apply CHG directly to the skin and wash gently with a scrungie or a clean washcloth.  ? ?Apply the CHG Soap to your body ONLY FROM THE NECK DOWN.  Do not use on open wounds or open sores. Avoid contact with your eyes, ears, mouth and genitals (private parts). Wash Face and genitals (private parts)  with  your normal soap.  ? ?Wash thoroughly, paying special attention to the area where your surgery will be performed. ? ?Thoroughly rinse your body with warm water from the neck down. ? ?DO NOT shower/wash with your normal soap after using and rinsing off the CHG Soap. ? ?Pat yourself dry with a CLEAN TOWEL. ? ?Wear CLEAN PAJAMAS to bed the night before surgery ? ?Place CLEAN SHEETS on your bed the night before your surgery ? ?DO NOT SLEEP WITH PETS. ? ? ?Day of Surgery: ? ?Take a shower with CHG soap. ?Wear Clean/Comfortable clothing the morning of surgery ?Do not apply any deodorants/lotions.   ?Remember to brush your teeth WITH YOUR REGULAR TOOTHPASTE. ? ? ? ?If you received a COVID test  during your pre-op visit, it is requested that you wear a mask when out in public, stay away from anyone that may not be feeling well, and notify your surgeon if you develop symptoms. If you have been in contact with anyone that has tested positive in the last 10 days, please notify your surgeon. ? ?  ?Please read over the following fact sheets that you were given.   ?

## 2021-05-23 ENCOUNTER — Encounter (HOSPITAL_COMMUNITY): Payer: Self-pay

## 2021-05-23 ENCOUNTER — Other Ambulatory Visit: Payer: Self-pay

## 2021-05-23 ENCOUNTER — Encounter (HOSPITAL_COMMUNITY)
Admission: RE | Admit: 2021-05-23 | Discharge: 2021-05-23 | Disposition: A | Discharge status: Home / Self Care | Payer: Medicare Other | Source: Ambulatory Visit | Attending: Internal Medicine | Admitting: Internal Medicine

## 2021-05-23 VITALS — BP 161/74 | HR 79 | Temp 98.6°F | Resp 17

## 2021-05-23 DIAGNOSIS — R011 Cardiac murmur, unspecified: Secondary | ICD-10-CM | POA: Insufficient documentation

## 2021-05-23 DIAGNOSIS — Z01818 Encounter for other preprocedural examination: Secondary | ICD-10-CM | POA: Diagnosis present

## 2021-05-23 DIAGNOSIS — I350 Nonrheumatic aortic (valve) stenosis: Secondary | ICD-10-CM | POA: Diagnosis not present

## 2021-05-23 DIAGNOSIS — Z20822 Contact with and (suspected) exposure to covid-19: Secondary | ICD-10-CM | POA: Insufficient documentation

## 2021-05-23 DIAGNOSIS — E7800 Pure hypercholesterolemia, unspecified: Secondary | ICD-10-CM | POA: Insufficient documentation

## 2021-05-23 DIAGNOSIS — I10 Essential (primary) hypertension: Secondary | ICD-10-CM | POA: Diagnosis not present

## 2021-05-23 DIAGNOSIS — E109 Type 1 diabetes mellitus without complications: Secondary | ICD-10-CM | POA: Diagnosis not present

## 2021-05-23 DIAGNOSIS — Z9641 Presence of insulin pump (external) (internal): Secondary | ICD-10-CM | POA: Insufficient documentation

## 2021-05-23 DIAGNOSIS — E0500 Thyrotoxicosis with diffuse goiter without thyrotoxic crisis or storm: Secondary | ICD-10-CM | POA: Insufficient documentation

## 2021-05-23 HISTORY — DX: Nonrheumatic aortic (valve) stenosis: I35.0

## 2021-05-23 LAB — COMPREHENSIVE METABOLIC PANEL
ALT: 30 U/L (ref 0–44)
AST: 28 U/L (ref 15–41)
Albumin: 3.8 g/dL (ref 3.5–5.0)
Alkaline Phosphatase: 51 U/L (ref 38–126)
Anion gap: 9 (ref 5–15)
BUN: 23 mg/dL (ref 8–23)
CO2: 24 mmol/L (ref 22–32)
Calcium: 9 mg/dL (ref 8.9–10.3)
Chloride: 102 mmol/L (ref 98–111)
Creatinine, Ser: 1.18 mg/dL (ref 0.61–1.24)
GFR, Estimated: >60 mL/min (ref >60)
Glucose, Bld: 301 mg/dL — ABNORMAL HIGH (ref 70–99)
Potassium: 4.4 mmol/L (ref 3.5–5.1)
Sodium: 135 mmol/L (ref 135–145)
Total Bilirubin: 1 mg/dL (ref 0.3–1.2)
Total Protein: 6.7 g/dL (ref 6.5–8.1)

## 2021-05-23 LAB — URINALYSIS, ROUTINE W REFLEX MICROSCOPIC
Bacteria, UA: NONE SEEN
Bilirubin Urine: NEGATIVE
Glucose, UA: >=500 mg/dL — AB
Hgb urine dipstick: NEGATIVE
Ketones, ur: NEGATIVE mg/dL
Leukocytes,Ua: NEGATIVE
Nitrite: NEGATIVE
Protein, ur: NEGATIVE mg/dL
Specific Gravity, Urine: 1.018 (ref 1.005–1.030)
pH: 6 (ref 5.0–8.0)

## 2021-05-23 LAB — TYPE AND SCREEN
ABO/RH(D): AB POS
Antibody Screen: NEGATIVE

## 2021-05-23 LAB — CBC
HCT: 43.8 % (ref 39.0–52.0)
Hemoglobin: 14 g/dL (ref 13.0–17.0)
MCH: 30 pg (ref 26.0–34.0)
MCHC: 32 g/dL (ref 30.0–36.0)
MCV: 93.8 fL (ref 80.0–100.0)
Platelets: 257 10*3/uL (ref 150–400)
RBC: 4.67 MIL/uL (ref 4.22–5.81)
RDW: 15.1 % (ref 11.5–15.5)
WBC: 14.4 10*3/uL — ABNORMAL HIGH (ref 4.0–10.5)
nRBC: 0 % (ref 0.0–0.2)

## 2021-05-23 LAB — SURGICAL PCR SCREEN
MRSA, PCR: NEGATIVE
Staphylococcus aureus: POSITIVE — AB

## 2021-05-23 LAB — PROTIME-INR
INR: 0.9 (ref 0.8–1.2)
Prothrombin Time: 11.6 seconds (ref 11.4–15.2)

## 2021-05-23 LAB — GLUCOSE, CAPILLARY: Glucose-Capillary: 327 mg/dL — ABNORMAL HIGH (ref 70–99)

## 2021-05-23 LAB — SARS CORONAVIRUS 2 (TAT 6-24 HRS): SARS Coronavirus 2: NEGATIVE

## 2021-05-23 NOTE — Progress Notes (Addendum)
PCP:  Thereasa Distance, MD ?Cardiologist:  Sharol Given, MD ?Endocrinologist: Mee Hives, MD ? ?EKG:  05/23/21 ?CXR:  05/23/21 ?ECHO:  02/11/21 ?Stress Test: denies ?Cardiac Cath:  05/01/21 ? ?Fasting Blood Sugar-  100-120 ?Checks Blood Sugar___ times a day.  Insulin pump.  Type 1 diabetes.  ?BG 327 at PAT ?Diabetes coordinator, Anderson Malta  notified ? ?OSA/CPAP: NO ? ?ASA: Continue through day before surgery. ?Blood Thinner: No ? ?Covid test 05/23/21 at PAT ? ?Anesthesia Review: Yes, cardiac history.  Elevated BG. ? ?Patient denies shortness of breath, fever, cough, and chest pain at PAT appointment. ? ?Patient verbalized understanding of instructions provided today at the PAT appointment.  Patient asked to review instructions at home and day of surgery.   ?

## 2021-05-26 ENCOUNTER — Encounter (HOSPITAL_COMMUNITY): Payer: Self-pay

## 2021-05-26 MED ORDER — MAGNESIUM SULFATE 50 % IJ SOLN
40.0000 meq | INTRAMUSCULAR | Status: DC
  Filled 2021-05-26: qty 9.85

## 2021-05-26 MED ORDER — NOREPINEPHRINE 4 MG/250ML-% IV SOLN
0.0000 ug/min | INTRAVENOUS | Status: AC
  Administered 2021-05-27: 2 ug/min via INTRAVENOUS
  Filled 2021-05-26: qty 250

## 2021-05-26 MED ORDER — DEXMEDETOMIDINE HCL IN NACL 400 MCG/100ML IV SOLN
0.1000 ug/kg/h | INTRAVENOUS | Status: AC
  Administered 2021-05-27: 86.2 ug via INTRAVENOUS
  Administered 2021-05-27: 1 ug/kg/h via INTRAVENOUS
  Filled 2021-05-26: qty 100

## 2021-05-26 MED ORDER — CEFAZOLIN SODIUM-DEXTROSE 2-4 GM/100ML-% IV SOLN
2.0000 g | INTRAVENOUS | Status: AC
  Administered 2021-05-27: 2 g via INTRAVENOUS
  Filled 2021-05-26: qty 100

## 2021-05-26 MED ORDER — HEPARIN 30,000 UNITS/1000 ML (OHS) CELLSAVER SOLUTION
Status: DC
  Filled 2021-05-26: qty 1000

## 2021-05-26 MED ORDER — POTASSIUM CHLORIDE 2 MEQ/ML IV SOLN
80.0000 meq | INTRAVENOUS | Status: DC
  Filled 2021-05-26: qty 40

## 2021-05-26 NOTE — Progress Notes (Signed)
Anesthesia Chart Review: ? Case: [id number] Date/Time: 05/27/21 1030  ? Procedures:  ?    Transcatheter Aortic Valve Replacement, Transfemoral ?    INTRAOPERATIVE TRANSTHORACIC ECHOCARDIOGRAM  ? Anesthesia type: Monitor Anesthesia Care  ? Pre-op diagnosis: Severe Aortic Stenosis  ? Location: [facility] ROOM 16 / [facility] OR  ? Surgeons: Early Osmond, MD  ? ?  ? ? ?DISCUSSION: Patient is a 71 year old male scheduled for the above procedure. ? ?History includes never smoker, DM 1 (insulin pump), HTN, hypercholesterolemia, murmur/aortic stenosis, hyperthyroidism/Grave's disease (diagnosed age 70; eye involvement 2021, s/p Tepezza for thyroid eye disease 11/2019, 04/2021), sinus surgery (05/30/20), spinal surgery (1980's 1990's). ? ?He had transient monocular vision in his left eye on 04/03/2021 lasting 2 to 3 hours.  Work-up for CVA confirmed severe valvular aortic stenosis with negative bubble study,  and CTA demonstrated 50% right ICA stenosis.  Brain MRI was negative for acute infarct.  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  According to 04/18/2021 ophthalmology note by Dr. Dennison Nancy, "incomplete CRVO OS by exam and angiography. This accounts for patient's ongoing visual symptoms. In concern with eyelid swelling, conjunctival injection and chemosis, elevated IOP (indicating increased episcleral venous pressure), the most likely cause of patient's presentation is not related to carotid disease or ophthalmic artery occlusive disease, but rather orbital compression in setting of known to thyroid eye disease." Tepezza treatment was initiated with recommendation to keep appointments with cardiology, neurology, and primary care. Has also been on prednisone. He may ultimately require eye surgery, but notes suggest aortic stenosis to be addressed first. Fortunately, he has had some improvement with Tepezza infusions Q 21 days. Currently also on prednisone 10 mg BID.    ? ?Dr. Ali Lowe has reviewed  PAT labs and wrote "Labs look okay for TAVR." ? ?Anesthesia team to evaluate on the day of surgery. Patient has an Omnipod insulin pump (Novolog 100 Units/mL), and PAT RN documented that she notified Dm coordinator. A1c not done with PAT labs, but was 6.6% on 02/07/21--has been on Tepazza and prednisone since then, so may account for hyperglycemia with labs. He will get a CBG on arrival for surgery--he reported fasting CBGs around 100-120.  ? ? ?VS: BP (!) 161/74   Pulse 79   Temp 37 ?C (Oral)   Resp 17   SpO2 97%  ? ? ?PROVIDERS: ?Feldpausch, Chrissie Noa, MD is PCP [facility], see [facility]) ?Ida Rogue, MD is primary cardiologist ?Lenna Sciara, MD is structural heart cardiologist  ?Mee Hives, MD is endocrinologist ?- Kelton Pillar, MD is ophthalmologist for thyroid eye disease ([facility]) ?- Mettu, Leandro Reasoner, MD ophthalmologist (retinal specialist) ? ? ?LABS: Preoperative labs from 05/23/21 noted. See DISCUSSION.  ?(all labs ordered are listed, but only abnormal results are displayed) ? ?Labs Reviewed  ?SURGICAL PCR SCREEN - Abnormal; Notable for the following components:  ?    Result Value  ? Staphylococcus aureus POSITIVE (*)   ? All other components within normal limits  ?GLUCOSE, CAPILLARY - Abnormal; Notable for the following components:  ? Glucose-Capillary 327 (*)   ? All other components within normal limits  ?CBC - Abnormal; Notable for the following components:  ? WBC 14.4 (*)   ? All other components within normal limits  ?COMPREHENSIVE METABOLIC PANEL - Abnormal; Notable for the following components:  ? Glucose, Bld 301 (*)   ? All other components within normal limits  ?URINALYSIS, ROUTINE W REFLEX MICROSCOPIC - Abnormal; Notable for  the following components:  ? Glucose, UA >=500 (*)   ? All other components within normal limits  ?SARS CORONAVIRUS 2 (TAT 6-24 HRS)  ?PROTIME-INR  ?TYPE AND SCREEN  ? ?As of 04/03/21 ([facility]): TSH 0.31, Free T4 0.83. A1c 6.6% on 02/07/21 ([facility]  [facility]). ? ? ?IMAGES: ?CXR 05/23/21: ?IMPRESSION: ?No acute process in the chest. ? ?CTA chest/abd/pelvis 05/20/21: ?IMPRESSION: ?1. Vascular findings and measurements pertinent to potential TAVR ?procedure, as detailed. ?2. Diffusely thickened and coarsely calcified aortic valve, ?compatible with reported aortic stenosis. ?3. Clustered indistinct basilar right lower lobe pulmonary nodules, ?largest 0.7 cm , new from 12/07/2014 chest CT. Non-contrast chest CT ?at 3-6 months is recommended. If nodules persist, subsequent ?management will be based upon the most suspicious nodule(s). This ?recommendation follows the consensus statement: Guidelines for ?Management of Incidental Pulmonary Nodules Detected on CT Images: ?From the Fleischner Society 2017; Radiology 2017; 284:228-243. ?4. Finely irregular liver surface, suggesting cirrhosis. No ?suspicious liver masses. Recommend correlation with liver function ?tests. ?5. Moderate sigmoid diverticulosis. ?6. Mildly enlarged prostate. ?7. Aortic Atherosclerosis (ICD10-I70.0). ?  ?MRI Brain 04/04/21 ([facility]): ?IMPRESSION:  ?No acute intracranial abnormalities. Specifically, no acute infarct.   ? ?CTA Head/neck 04/03/21 ([facility]): ?IMPRESSION:  ?1. No intracranial large vessel occlusion.  ?2. Moderate predominantly soft atherosclerotic plaque about the right  ?cavernous ICA results in approximately 50% stenosis.   ? ? ? ?EKG: 05/23/21: ?Normal sinus rhythm ?Minimal voltage criteria for LVH, may be normal variant ( [employer] product ) ?Borderline ECG ?When compared with ECG of 27-Nov-2019 15:28, ?No significant change since last tracing ?Confirmed by Cristopher Peru [phone]) on 05/23/2021 10:07:49 PM ? ? ?CV: ?RHC/LHC 05/01/21: ?  There is severe aortic valve stenosis. ?  ?1.  Normal left dominant circulation; the right coronary artery could not be engaged selectively however nonselective injection demonstrated this to be small and nondominant. ?2.  Normal cardiac output and index with normal  filling pressures ?3.  Severe aortic stenosis with peak to peak gradient of 80 mmHg. ?4.  Capacious iliofemoral vessels bilaterally. ? ? ?Echo with Bubble Study 04/03/21 ([facility]): ?-  NORMAL LEFT VENTRICULAR SYSTOLIC FUNCTION WITH MILD LVH. LVEF > 55% (estimated). Calculated EF: 55%. ?- NORMAL RIGHT VENTRICULAR SYSTOLIC FUNCTION  ?- VALVULAR REGURGITATION: TRIVIAL AR, TRIVIAL MR, TRIVIAL PR, TRIVIAL TR  ?- VALVULAR STENOSIS: SEVERE AS  ?EVERE AORTIC STENOSIS WITH TRIVIAL AORTIC REGURGITATION  ?5.2 m/s peak vel  110 mmHg peak grad  57 mmHg mean grad 0.5 cm2 by DOPPLER  ? LVOT Diam: 1.8 cm. Resting LVOT Vel: 0.8 m/s. Dimensionless Index: 0.19   ?- NEGATIVE SALINE CONTRAST STUDY   ? ? ?Event Monitor 05/17/19:  ?Monitor #1 worn for 24 hours ?Normal sinus rhythm ?avg HR of 103 bpm.  ?Isolated SVEs were rare (<1.0%), SVE Couplets were rare (<1.0%), and no SVE Triplets were present.  ?Isolated VEs were rare (<1.0%), and no VE Couplets or VE Triplets were present. ?Patient triggered event associated with sinus tachycardia rate 136 bpm ?  ?Monitor #2 worn for >7 days ?Normal sinus rhythm ?min HR of 57 bpm, max HR of 182 bpm, and avg HR of 88 bpm.  ?  ?1 run of Ventricular Tachycardia occurred lasting 5 beats with a max rate of 182 bpm (avg 178 bpm).  ?1 run of Supraventricular Tachycardia occurred lasting 4 beats with a max rate of 125 bpm (avg 120 bpm).  ?  ?Isolated SVEs were rare (<1.0%), SVE Couplets were rare (<1.0%), and no  SVE Triplets were present. Isolated VEs were rare (<1.0%), VE Couplets were rare ?(<1.0%), and no VE Triplets were present. Ventricular Trigeminy was present. ?  ?Patient triggered events associated with sinus tachycardia ? ? ?Past Medical History:  ?Diagnosis Date  ? Aortic stenosis   ? Diabetes mellitus without complication ([place])   ? Heart murmur   ? Hypercholesteremia   ? Hypertension   ? Thyroid disease   ? Grave's Disease wtih thyroid eye disease  ? ? ?Past Surgical History:  ?Procedure  Laterality Date  ? ABDOMINAL AORTOGRAM N/A 05/01/2021  ? Procedure: ABDOMINAL AORTOGRAM;  Surgeon: Early Osmond, MD;  Location: [facility];  Service: Cardiovascular;  Laterality: N/A;  ? arm surgery    ? BACK SURGE

## 2021-05-26 NOTE — H&P (Signed)
? ?   ?[address] ?      [place] [address] ?            [phone]   ? ?  ?Cardiothoracic Surgery Admission History and Physical ? ? ?PCP is Feldpausch, Chrissie Noa, MD ?Referring Provider is Lenna Sciara, MD ?Primary Cardiologist is Ida Rogue, MD ?  ?Reason for admission:  Severe aortic stenosis ?  ?HPI: ?  ?The patient is a 71 year old gentleman with history of hypertension, hypercholesterolemia, type 2 diabetes, and Graves' disease with ophthalmopathy followed by Dr. Sharmaine Base at [facility].  He is a very active gentleman who continues to work daily doing heavy physical activities including heavy lifting without any symptoms.  He denies any shortness of breath or fatigue.  He denies chest discomfort.  He denies dizziness and syncope.  He has no orthopnea or peripheral edema. ?  ?His ophthalmopathy from Graves' disease recently recurred and he has been treated with prednisone.  It was felt that he may require endoscopic surgical decompression under general anesthesia.  He has known severe aortic stenosis by echocardiogram most recently on 02/11/2021.  This showed a mean gradient of 42.5 mmHg and a valve area of 0.98 cm?.  Left ventricular ejection fraction was 60 to 65%.  The plan was to continue following his aortic stenosis since he was completely asymptomatic but now since he may require general anesthesia it was felt that he should be evaluated for TAVR to safely allow him to undergo general anesthesia.  He underwent cardiac catheterization on 05/01/2021 showing severe aortic stenosis with a peak to peak gradient of 80 mmHg.  Filling pressures and cardiac output were normal.  Coronary arteries were normal. ?  ?He recently started on intermittent infusions of Tepezza every 3 weeks for his ophthalmopathy which has significantly improved it and he may not require surgical decompression. ?  ?    ?Past Medical History:  ?Diagnosis Date  ? Diabetes mellitus without complication ([place])    ? Heart  murmur    ? Hypercholesteremia    ? Hypertension    ? Thyroid disease    ?  ?  ?     ?Past Surgical History:  ?Procedure Laterality Date  ? ABDOMINAL AORTOGRAM N/A 05/01/2021  ?  Procedure: ABDOMINAL AORTOGRAM;  Surgeon: Early Osmond, MD;  Location: [facility];  Service: Cardiovascular;  Laterality: N/A;  ? arm surgery      ? BACK SURGERY      ? COLONOSCOPY WITH PROPOFOL N/A 02/17/2017  ?  Procedure: COLONOSCOPY WITH PROPOFOL;  Surgeon: [place], Benay Pike, MD;  Location: [facility];  Service: Gastroenterology;  Laterality: N/A;  ? ENDOSCOPIC CONCHA BULLOSA RESECTION Right 05/30/2020  ?  Procedure: ENDOSCOPIC CONCHA BULLOSA RESECTION;  Surgeon: Margaretha Sheffield, MD;  Location: [facility];  Service: ENT;  Laterality: Right;  ? EXCISION NASAL MASS Left 05/30/2020  ?  Procedure: EXCISION NASAL MASS;  Surgeon: Margaretha Sheffield, MD;  Location: [facility];  Service: ENT;  Laterality: Left;  ? EYE SURGERY      ?  cataract extraction  ? NASAL SEPTOPLASTY W/ TURBINOPLASTY N/A 05/30/2020  ?  Procedure: NASAL SEPTOPLASTY WITH TURBINATE REDUCTION;  Surgeon: Margaretha Sheffield, MD;  Location: [facility];  Service: ENT;  Laterality: N/A;  ? RIGHT/LEFT HEART CATH AND CORONARY ANGIOGRAPHY N/A 05/01/2021  ?  Procedure: RIGHT/LEFT HEART CATH AND CORONARY ANGIOGRAPHY;  Surgeon: Early Osmond, MD;  Location: [facility];  Service:  Cardiovascular;  Laterality: N/A;  ? SHOULDER SURGERY      ? TEE WITHOUT CARDIOVERSION N/A 03/01/2020  ?  Procedure: TRANSESOPHAGEAL ECHOCARDIOGRAM (TEE);  Surgeon: Minna Merritts, MD;  Location: [facility];  Service: Cardiovascular;  Laterality: N/A;  ?  ?  ?     ?Family History  ?Problem Relation Age of Onset  ? Healthy Mother    ? Diabetes Father    ? Hypertension Father    ?  ?  ?Social History  ?  ?     ?Socioeconomic History  ? Marital status: Married  ?    Spouse name: Not on file  ? Number of children: Not on file  ? Years of education: Not on file  ? Highest  education level: Not on file  ?Occupational History  ? Not on file  ?Tobacco Use  ? Smoking status: Never  ? Smokeless tobacco: Never  ?Vaping Use  ? Vaping Use: Never used  ?Substance and Sexual Activity  ? Alcohol use: No  ? Drug use: No  ? Sexual activity: Not on file  ?Other Topics Concern  ? Not on file  ?Social History Narrative  ? Not on file  ?  ?Social Determinants of Health  ?  ?Financial Resource Strain: Not on file  ?Food Insecurity: Not on file  ?Transportation Needs: Not on file  ?Physical Activity: Not on file  ?Stress: Not on file  ?Social Connections: Not on file  ?Intimate Partner Violence: Not on file  ?  ?  ?       ?Prior to Admission medications   ?Medication Sig Start Date End Date Taking? Authorizing Provider  ?EPINEPHrine (EPIPEN 2-PAK) 0.3 mg/0.3 mL IJ SOAJ injection Inject 0.3 mg into the muscle as needed for anaphylaxis. 10/17/19   Yes Merlyn Lot, MD  ?fexofenadine (ALLEGRA) 180 MG tablet Take 180 mg by mouth daily.     Yes [provider]  ?insulin aspart (NOVOLOG) 100 UNIT/ML injection INJECT UP TO 60 UNITS UNDER THE SKIN VIA PUMP AS DIRECTED     Yes [provider]  ?Insulin Disposable Pump (OMNIPOD 5 G6 INTRO, GEN 5,) KIT Inject into the skin. 02/07/21   Yes [provider]  ?losartan (COZAAR) 50 MG tablet TAKE 1 TABLET(50 MG) BY MOUTH DAILY 04/21/21   Yes Gollan, Kathlene November, MD  ?methimazole (TAPAZOLE) 10 MG tablet Take 10-15 mg by mouth See admin instructions. Alternate 10 mg and 15 mg every other day 05/17/19   Yes [provider]  ?Multiple Vitamin (MULTIVITAMIN) tablet Take 1 tablet by mouth daily.     Yes [provider]  ?predniSONE (DELTASONE) 10 MG tablet Start with 3 pills tomorrow. Taper over the next 6 days.  3,3,2,2,1,1. 05/30/20   Yes Margaretha Sheffield, MD  ?tamsulosin (FLOMAX) 0.4 MG CAPS capsule Take 0.4 mg by mouth every evening. 02/18/19   Yes [provider]  ?triamcinolone cream (KENALOG) 0.1 % Apply 1 application.  topically daily as needed (Rash). 09/14/16   Yes [provider]  ?  ?  ?      ?Current Outpatient Medications  ?Medication Sig Dispense Refill  ? EPINEPHrine (EPIPEN 2-PAK) 0.3 mg/0.3 mL IJ SOAJ injection Inject 0.3 mg into the muscle as needed for anaphylaxis. 1 each 1  ? fexofenadine (ALLEGRA) 180 MG tablet Take 180 mg by mouth daily.      ? insulin aspart (NOVOLOG) 100 UNIT/ML injection INJECT UP TO 60 UNITS UNDER THE SKIN VIA PUMP AS DIRECTED      ?  Insulin Disposable Pump (OMNIPOD 5 G6 INTRO, GEN 5,) KIT Inject into the skin.      ? losartan (COZAAR) 50 MG tablet TAKE 1 TABLET(50 MG) BY MOUTH DAILY 90 tablet 3  ? methimazole (TAPAZOLE) 10 MG tablet Take 10-15 mg by mouth See admin instructions. Alternate 10 mg and 15 mg every other day      ? Multiple Vitamin (MULTIVITAMIN) tablet Take 1 tablet by mouth daily.      ? predniSONE (DELTASONE) 10 MG tablet Start with 3 pills tomorrow. Taper over the next 6 days.  3,3,2,2,1,1. 12 tablet 0  ? tamsulosin (FLOMAX) 0.4 MG CAPS capsule Take 0.4 mg by mouth every evening.      ? triamcinolone cream (KENALOG) 0.1 % Apply 1 application. topically daily as needed (Rash).      ?  ?         ?Current Facility-Administered Medications  ?Medication Dose Route Frequency Provider Last Rate Last Admin  ? sodium chloride flush (NS) 0.9 % injection 3 mL  3 mL Intravenous Q12H Early Osmond, MD      ?  ?  ?     ?Allergies  ?Allergen Reactions  ? Atorvastatin Other (See Comments)  ?    Muscle cramp  ? Ezetimibe Other (See Comments)  ?    Muscle cramps  ?  ?  ?  ?  ?Review of Systems: ?  ?            General:                      normal appetite, normal energy, no weight gain, no weight loss, no fever ?            Cardiac:                       no chest pain with exertion, no chest pain at rest, no SOB with exertion, no resting SOB, no PND, no orthopnea, no palpitations, no arrhythmia, no atrial fibrillation, no LE edema, no dizzy spells, no syncope ?            Respiratory:                  no shortness of breath, no home oxygen, no productive cough, no dry cough, no bronchitis, no wheezing, no hemoptysis, no asthma, no pain with inspiration or cough, no sleep apnea, no CPAP at night

## 2021-05-26 NOTE — Anesthesia Preprocedure Evaluation (Addendum)
Anesthesia Evaluation  ?Patient identified by MRN, date of birth, ID band ?Patient awake ? ? ? ?Reviewed: ?Allergy & Precautions, H&P , NPO status , Patient's Chart, lab work & pertinent test results ? ?Airway ?Mallampati: III ? ?TM Distance: >3 FB ?Neck ROM: Full ? ? ? Dental ?no notable dental hx. ?(+) Teeth Intact, Dental Advisory Given ?  ?Pulmonary ?neg pulmonary ROS,  ?  ?Pulmonary exam normal ?breath sounds clear to auscultation ? ? ? ? ? ? Cardiovascular ?hypertension, Pt. on medications ?+ Valvular Problems/Murmurs AS  ?Rhythm:Regular Rate:Normal ? ? ?  ?Neuro/Psych ?negative neurological ROS ? negative psych ROS  ? GI/Hepatic ?negative GI ROS, Neg liver ROS,   ?Endo/Other  ?diabetes, Type 1, Insulin Dependent ? Renal/GU ?negative Renal ROS  ?negative genitourinary ?  ?Musculoskeletal ? ? Abdominal ?  ?Peds ? Hematology ?negative hematology ROS ?(+)   ?Anesthesia Other Findings ? ? Reproductive/Obstetrics ?negative OB ROS ? ?  ? ? ? ? ? ? ? ? ? ? ? ? ? ?  ?  ? ? ? ? ? ? ?Anesthesia Physical ?Anesthesia Plan ? ?ASA: 4 ? ?Anesthesia Plan: MAC  ? ?Post-op Pain Management: Tylenol PO (pre-op)* and Minimal or no pain anticipated  ? ?Induction: Intravenous ? ?PONV Risk Score and Plan: 1 and Propofol infusion and Midazolam ? ?Airway Management Planned: Natural Airway and Simple Face Mask ? ?Additional Equipment: Arterial line ? ?Intra-op Plan:  ? ?Post-operative Plan:  ? ?Informed Consent: I have reviewed the patients History and Physical, chart, labs and discussed the procedure including the risks, benefits and alternatives for the proposed anesthesia with the patient or authorized representative who has indicated his/her understanding and acceptance.  ? ? ? ?Dental advisory given ? ?Plan Discussed with: CRNA ? ?Anesthesia Plan Comments: (PAT note written 05/26/2021 by Myra Gianotti, PA-C. ?)  ? ? ? ? ? ?Anesthesia Quick Evaluation ? ?

## 2021-05-27 ENCOUNTER — Inpatient Hospital Stay (HOSPITAL_COMMUNITY)
Admission: RE | Admit: 2021-05-27 | Discharge: 2021-05-27 | Disposition: A | Discharge status: Home / Self Care | Payer: Medicare Other | Source: Ambulatory Visit | Attending: Internal Medicine | Admitting: Internal Medicine

## 2021-05-27 ENCOUNTER — Inpatient Hospital Stay (HOSPITAL_COMMUNITY)
Admission: RE | Admit: 2021-05-27 | Discharge: 2021-05-28 | DRG: 267 | Disposition: A | Discharge status: Home / Self Care | Payer: Medicare Other | Attending: Internal Medicine | Admitting: Internal Medicine

## 2021-05-27 ENCOUNTER — Encounter (HOSPITAL_COMMUNITY): Payer: Self-pay | Admitting: Internal Medicine

## 2021-05-27 ENCOUNTER — Encounter (HOSPITAL_COMMUNITY)
Admission: RE | Disposition: A | Discharge status: Home / Self Care | Payer: Self-pay | Source: Home / Self Care | Attending: Internal Medicine

## 2021-05-27 ENCOUNTER — Other Ambulatory Visit: Payer: Self-pay

## 2021-05-27 ENCOUNTER — Inpatient Hospital Stay (HOSPITAL_COMMUNITY): Payer: Medicare Other

## 2021-05-27 ENCOUNTER — Inpatient Hospital Stay (HOSPITAL_COMMUNITY): Payer: Medicare Other | Admitting: Vascular Surgery

## 2021-05-27 ENCOUNTER — Other Ambulatory Visit: Payer: Self-pay | Admitting: Physician Assistant

## 2021-05-27 DIAGNOSIS — Z952 Presence of prosthetic heart valve: Secondary | ICD-10-CM

## 2021-05-27 DIAGNOSIS — E119 Type 2 diabetes mellitus without complications: Secondary | ICD-10-CM

## 2021-05-27 DIAGNOSIS — Z8249 Family history of ischemic heart disease and other diseases of the circulatory system: Secondary | ICD-10-CM

## 2021-05-27 DIAGNOSIS — Z006 Encounter for examination for normal comparison and control in clinical research program: Secondary | ICD-10-CM

## 2021-05-27 DIAGNOSIS — Z888 Allergy status to other drugs, medicaments and biological substances status: Secondary | ICD-10-CM | POA: Diagnosis not present

## 2021-05-27 DIAGNOSIS — E109 Type 1 diabetes mellitus without complications: Secondary | ICD-10-CM | POA: Diagnosis present

## 2021-05-27 DIAGNOSIS — E7800 Pure hypercholesterolemia, unspecified: Secondary | ICD-10-CM | POA: Diagnosis present

## 2021-05-27 DIAGNOSIS — I10 Essential (primary) hypertension: Secondary | ICD-10-CM | POA: Diagnosis present

## 2021-05-27 DIAGNOSIS — Z79899 Other long term (current) drug therapy: Secondary | ICD-10-CM | POA: Diagnosis not present

## 2021-05-27 DIAGNOSIS — E0500 Thyrotoxicosis with diffuse goiter without thyrotoxic crisis or storm: Secondary | ICD-10-CM

## 2021-05-27 DIAGNOSIS — Z833 Family history of diabetes mellitus: Secondary | ICD-10-CM

## 2021-05-27 DIAGNOSIS — I350 Nonrheumatic aortic (valve) stenosis: Principal | ICD-10-CM | POA: Diagnosis present

## 2021-05-27 DIAGNOSIS — Z794 Long term (current) use of insulin: Secondary | ICD-10-CM

## 2021-05-27 DIAGNOSIS — Z9641 Presence of insulin pump (external) (internal): Secondary | ICD-10-CM | POA: Diagnosis present

## 2021-05-27 DIAGNOSIS — I700 Atherosclerosis of aorta: Secondary | ICD-10-CM | POA: Diagnosis present

## 2021-05-27 HISTORY — DX: Thyrotoxicosis with diffuse goiter without thyrotoxic crisis or storm: H05.839

## 2021-05-27 HISTORY — PX: TRANSCATHETER AORTIC VALVE REPLACEMENT, TRANSFEMORAL: SHX6400

## 2021-05-27 HISTORY — PX: INTRAOPERATIVE TRANSTHORACIC ECHOCARDIOGRAM: SHX6523

## 2021-05-27 HISTORY — DX: Nonrheumatic aortic (valve) stenosis: I35.0

## 2021-05-27 HISTORY — DX: Thyrotoxicosis with diffuse goiter without thyrotoxic crisis or storm: E05.00

## 2021-05-27 HISTORY — DX: Presence of prosthetic heart valve: Z95.2

## 2021-05-27 HISTORY — PX: ULTRASOUND GUIDANCE FOR VASCULAR ACCESS: SHX6516

## 2021-05-27 LAB — ECHOCARDIOGRAM LIMITED
AR max vel: 2.34 cm2
AV Area VTI: 2.99 cm2
AV Area mean vel: 2.8 cm2
AV Mean grad: 6 mmHg
AV Peak grad: 13.7 mmHg
Ao pk vel: 1.85 m/s

## 2021-05-27 LAB — POCT I-STAT, CHEM 8
BUN: 24 mg/dL — ABNORMAL HIGH (ref 8–23)
BUN: 21 mg/dL (ref 8–23)
Calcium, Ion: 1.13 mmol/L — ABNORMAL LOW (ref 1.15–1.40)
Calcium, Ion: 1.06 mmol/L — ABNORMAL LOW (ref 1.15–1.40)
Chloride: 102 mmol/L (ref 98–111)
Chloride: 104 mmol/L (ref 98–111)
Creatinine, Ser: 1 mg/dL (ref 0.61–1.24)
Creatinine, Ser: 1 mg/dL (ref 0.61–1.24)
Glucose, Bld: 83 mg/dL (ref 70–99)
Glucose, Bld: 82 mg/dL (ref 70–99)
HCT: 36 % — ABNORMAL LOW (ref 39.0–52.0)
HCT: 38 % — ABNORMAL LOW (ref 39.0–52.0)
Hemoglobin: 12.2 g/dL — ABNORMAL LOW (ref 13.0–17.0)
Hemoglobin: 12.9 g/dL — ABNORMAL LOW (ref 13.0–17.0)
Potassium: 3.6 mmol/L (ref 3.5–5.1)
Potassium: 3.5 mmol/L (ref 3.5–5.1)
Sodium: 141 mmol/L (ref 135–145)
Sodium: 141 mmol/L (ref 135–145)
TCO2: 31 mmol/L (ref 22–32)
TCO2: 27 mmol/L (ref 22–32)

## 2021-05-27 LAB — BLOOD GAS, ARTERIAL
Acid-Base Excess: 1.5 mmol/L (ref 0.0–2.0)
Bicarbonate: 25.9 mmol/L (ref 20.0–28.0)
O2 Saturation: 99.2 %
Patient temperature: 37
pCO2 arterial: 39 mmHg (ref 32–48)
pH, Arterial: 7.43 (ref 7.35–7.45)
pO2, Arterial: 120 mmHg — ABNORMAL HIGH (ref 83–108)

## 2021-05-27 LAB — POCT I-STAT 7, (LYTES, BLD GAS, ICA,H+H)
Acid-Base Excess: 4 mmol/L — ABNORMAL HIGH (ref 0.0–2.0)
Bicarbonate: 31 mmol/L — ABNORMAL HIGH (ref 20.0–28.0)
Calcium, Ion: 1.24 mmol/L (ref 1.15–1.40)
HCT: 37 % — ABNORMAL LOW (ref 39.0–52.0)
Hemoglobin: 12.6 g/dL — ABNORMAL LOW (ref 13.0–17.0)
O2 Saturation: 99 %
Potassium: 3.6 mmol/L (ref 3.5–5.1)
Sodium: 141 mmol/L (ref 135–145)
TCO2: 33 mmol/L — ABNORMAL HIGH (ref 22–32)
pCO2 arterial: 57.2 mmHg — ABNORMAL HIGH (ref 32–48)
pH, Arterial: 7.342 — ABNORMAL LOW (ref 7.35–7.45)
pO2, Arterial: 129 mmHg — ABNORMAL HIGH (ref 83–108)

## 2021-05-27 LAB — GLUCOSE, CAPILLARY
Glucose-Capillary: 138 mg/dL — ABNORMAL HIGH (ref 70–99)
Glucose-Capillary: 116 mg/dL — ABNORMAL HIGH (ref 70–99)
Glucose-Capillary: 156 mg/dL — ABNORMAL HIGH (ref 70–99)

## 2021-05-27 LAB — ABO/RH: ABO/RH(D): AB POS

## 2021-05-27 SURGERY — IMPLANTATION, AORTIC VALVE, TRANSCATHETER, FEMORAL APPROACH
Anesthesia: Monitor Anesthesia Care | Site: Groin

## 2021-05-27 MED ORDER — ONDANSETRON HCL 4 MG/2ML IJ SOLN: 4.0000 mg | Freq: Four times a day (QID) | INTRAMUSCULAR | Status: DC | PRN

## 2021-05-27 MED ORDER — CHLORHEXIDINE GLUCONATE 0.12 % MT SOLN
15.0000 mL | Freq: Once | OROMUCOSAL | Status: AC
  Administered 2021-05-27: 15 mL via OROMUCOSAL
  Filled 2021-05-27: qty 15

## 2021-05-27 MED ORDER — NITROGLYCERIN IN D5W 200-5 MCG/ML-% IV SOLN: 0.0000 ug/min | INTRAVENOUS | Status: DC

## 2021-05-27 MED ORDER — LIDOCAINE HCL (PF) 1 % IJ SOLN
INTRAMUSCULAR | Status: AC
  Filled 2021-05-27: qty 30

## 2021-05-27 MED ORDER — FENTANYL CITRATE (PF) 100 MCG/2ML IJ SOLN
INTRAMUSCULAR | Status: DC | PRN
  Administered 2021-05-27: 50 ug via INTRAVENOUS

## 2021-05-27 MED ORDER — CHLORHEXIDINE GLUCONATE 4 % EX LIQD: 30.0000 mL | CUTANEOUS | Status: DC

## 2021-05-27 MED ORDER — EPHEDRINE 5 MG/ML INJ
INTRAVENOUS | Status: AC
  Filled 2021-05-27: qty 5

## 2021-05-27 MED ORDER — GLYCOPYRROLATE 0.2 MG/ML IJ SOLN
INTRAMUSCULAR | Status: DC | PRN
  Administered 2021-05-27: .2 mg via INTRAVENOUS

## 2021-05-27 MED ORDER — IODIXANOL 320 MG/ML IV SOLN
INTRAVENOUS | Status: DC | PRN
  Administered 2021-05-27: 66.3 mL via INTRA_ARTERIAL

## 2021-05-27 MED ORDER — ACETAMINOPHEN 500 MG PO TABS
1000.0000 mg | ORAL_TABLET | Freq: Once | ORAL | Status: AC
  Administered 2021-05-27: 1000 mg via ORAL
  Filled 2021-05-27: qty 2

## 2021-05-27 MED ORDER — SODIUM CHLORIDE 0.9 % IV SOLN: INTRAVENOUS | Status: AC

## 2021-05-27 MED ORDER — TRAMADOL HCL 50 MG PO TABS: 50.0000 mg | ORAL_TABLET | ORAL | Status: DC | PRN

## 2021-05-27 MED ORDER — ROCURONIUM BROMIDE 10 MG/ML (PF) SYRINGE
PREFILLED_SYRINGE | INTRAVENOUS | Status: AC
  Filled 2021-05-27: qty 10

## 2021-05-27 MED ORDER — ONDANSETRON HCL 4 MG/2ML IJ SOLN
INTRAMUSCULAR | Status: AC
  Filled 2021-05-27: qty 2

## 2021-05-27 MED ORDER — LIDOCAINE 2% (20 MG/ML) 5 ML SYRINGE
INTRAMUSCULAR | Status: AC
  Filled 2021-05-27: qty 5

## 2021-05-27 MED ORDER — PREDNISONE 20 MG PO TABS
20.0000 mg | ORAL_TABLET | Freq: Every day | ORAL | Status: DC
  Administered 2021-05-28: 20 mg via ORAL
  Filled 2021-05-27: qty 1

## 2021-05-27 MED ORDER — PHENYLEPHRINE 80 MCG/ML (10ML) SYRINGE FOR IV PUSH (FOR BLOOD PRESSURE SUPPORT)
PREFILLED_SYRINGE | INTRAVENOUS | Status: AC
  Filled 2021-05-27: qty 10

## 2021-05-27 MED ORDER — SODIUM CHLORIDE 0.9 % IV SOLN: 250.0000 mL | INTRAVENOUS | Status: DC | PRN

## 2021-05-27 MED ORDER — HEPARIN SODIUM (PORCINE) 1000 UNIT/ML IJ SOLN
INTRAMUSCULAR | Status: AC
  Filled 2021-05-27: qty 30

## 2021-05-27 MED ORDER — SODIUM CHLORIDE 0.9% FLUSH
3.0000 mL | Freq: Two times a day (BID) | INTRAVENOUS | Status: DC
  Administered 2021-05-27 – 2021-05-28 (×2): 3 mL via INTRAVENOUS

## 2021-05-27 MED ORDER — OXYCODONE HCL 5 MG PO TABS: 5.0000 mg | ORAL_TABLET | ORAL | Status: DC | PRN

## 2021-05-27 MED ORDER — HEPARIN 6000 UNIT IRRIGATION SOLUTION
Status: DC | PRN
  Administered 2021-05-27: 1

## 2021-05-27 MED ORDER — ACETAMINOPHEN 325 MG PO TABS: 650.0000 mg | ORAL_TABLET | Freq: Four times a day (QID) | ORAL | Status: DC | PRN

## 2021-05-27 MED ORDER — CHLORHEXIDINE GLUCONATE 4 % EX LIQD: 60.0000 mL | Freq: Once | CUTANEOUS | Status: DC

## 2021-05-27 MED ORDER — METHIMAZOLE 10 MG PO TABS
10.0000 mg | ORAL_TABLET | ORAL | Status: DC
  Administered 2021-05-28: 10 mg via ORAL
  Filled 2021-05-27: qty 1

## 2021-05-27 MED ORDER — SODIUM CHLORIDE 0.9 % IV SOLN: INTRAVENOUS | Status: DC

## 2021-05-27 MED ORDER — MIDAZOLAM HCL 2 MG/2ML IJ SOLN
INTRAMUSCULAR | Status: DC | PRN
  Administered 2021-05-27: 2 mg via INTRAVENOUS

## 2021-05-27 MED ORDER — EPHEDRINE SULFATE-NACL 50-0.9 MG/10ML-% IV SOSY
PREFILLED_SYRINGE | INTRAVENOUS | Status: DC | PRN
  Administered 2021-05-27 (×3): 5 mg via INTRAVENOUS
  Administered 2021-05-27: 10 mg via INTRAVENOUS

## 2021-05-27 MED ORDER — HEPARIN 6000 UNIT IRRIGATION SOLUTION
Status: AC
  Filled 2021-05-27: qty 1500

## 2021-05-27 MED ORDER — ACETAMINOPHEN 650 MG RE SUPP: 650.0000 mg | Freq: Four times a day (QID) | RECTAL | Status: DC | PRN

## 2021-05-27 MED ORDER — FENTANYL CITRATE (PF) 250 MCG/5ML IJ SOLN
INTRAMUSCULAR | Status: AC
  Filled 2021-05-27: qty 5

## 2021-05-27 MED ORDER — SODIUM CHLORIDE 0.9% FLUSH: 3.0000 mL | INTRAVENOUS | Status: DC | PRN

## 2021-05-27 MED ORDER — TEPROTUMUMAB-TRBW 500 MG IV SOLR: 500.0000 mg | INTRAVENOUS | Status: DC

## 2021-05-27 MED ORDER — METHIMAZOLE 10 MG PO TABS
15.0000 mg | ORAL_TABLET | ORAL | Status: DC
  Administered 2021-05-27: 15 mg via ORAL
  Filled 2021-05-27 (×3): qty 1.5

## 2021-05-27 MED ORDER — LORATADINE 10 MG PO TABS
10.0000 mg | ORAL_TABLET | Freq: Every day | ORAL | Status: DC
  Administered 2021-05-27 – 2021-05-28 (×2): 10 mg via ORAL
  Filled 2021-05-27 (×2): qty 1

## 2021-05-27 MED ORDER — HEPARIN SODIUM (PORCINE) 1000 UNIT/ML IJ SOLN
INTRAMUSCULAR | Status: DC | PRN
  Administered 2021-05-27: 13000 [IU] via INTRAVENOUS

## 2021-05-27 MED ORDER — MIDAZOLAM HCL 2 MG/2ML IJ SOLN
INTRAMUSCULAR | Status: AC
  Filled 2021-05-27: qty 2

## 2021-05-27 MED ORDER — MORPHINE SULFATE (PF) 2 MG/ML IV SOLN: 1.0000 mg | INTRAVENOUS | Status: DC | PRN

## 2021-05-27 MED ORDER — LIDOCAINE HCL 1 % IJ SOLN
INTRAMUSCULAR | Status: DC | PRN
  Administered 2021-05-27: 16 mL

## 2021-05-27 MED ORDER — CEFAZOLIN SODIUM-DEXTROSE 2-4 GM/100ML-% IV SOLN
2.0000 g | Freq: Three times a day (TID) | INTRAVENOUS | Status: AC
  Administered 2021-05-27 – 2021-05-28 (×2): 2 g via INTRAVENOUS
  Filled 2021-05-27 (×2): qty 100

## 2021-05-27 MED ORDER — PROPOFOL 10 MG/ML IV BOLUS
INTRAVENOUS | Status: AC
  Filled 2021-05-27: qty 20

## 2021-05-27 MED ORDER — INSULIN PUMP
Freq: Three times a day (TID) | SUBCUTANEOUS | Status: DC
  Filled 2021-05-27: qty 1

## 2021-05-27 MED ORDER — PROTAMINE SULFATE 10 MG/ML IV SOLN
INTRAVENOUS | Status: DC | PRN
  Administered 2021-05-27: 130 mg via INTRAVENOUS

## 2021-05-27 MED ORDER — INSULIN ASPART 100 UNIT/ML IJ SOLN: 0.0000 [IU] | INTRAMUSCULAR | Status: DC

## 2021-05-27 MED ORDER — PROPOFOL 500 MG/50ML IV EMUL
INTRAVENOUS | Status: DC | PRN
  Administered 2021-05-27: 25 ug/kg/min via INTRAVENOUS

## 2021-05-27 MED ORDER — ASPIRIN EC 81 MG PO TBEC
81.0000 mg | DELAYED_RELEASE_TABLET | Freq: Every day | ORAL | Status: DC
  Administered 2021-05-27 – 2021-05-28 (×2): 81 mg via ORAL
  Filled 2021-05-27 (×2): qty 1

## 2021-05-27 MED ORDER — LACTATED RINGERS IV SOLN
INTRAVENOUS | Status: DC
Start: 2021-05-27 — End: 2021-05-27

## 2021-05-27 MED ORDER — TAMSULOSIN HCL 0.4 MG PO CAPS
0.4000 mg | ORAL_CAPSULE | Freq: Every evening | ORAL | Status: DC
  Administered 2021-05-27: 0.4 mg via ORAL
  Filled 2021-05-27: qty 1

## 2021-05-27 SURGICAL SUPPLY — 67 items
BAG DECANTER FOR FLEXI CONT (MISCELLANEOUS) IMPLANT
BLADE CLIPPER SURG (BLADE) ×3 IMPLANT
BLADE OSCILLATING /SAGITTAL (BLADE) IMPLANT
BLADE STERNUM SYSTEM 6 (BLADE) IMPLANT
CABLE ADAPT CONN TEMP 6FT (ADAPTER) ×3 IMPLANT
CATH DIAG 6FR PIGTAIL (CATHETERS) ×3 IMPLANT
CATH DIAG EXPO 6F AL1 (CATHETERS) IMPLANT
CATH DIAG EXPO 6F VENT PIG 145 (CATHETERS) ×3 IMPLANT
CATH INFINITI 6F AL2 (CATHETERS) IMPLANT
CATH S G BIP PACING (CATHETERS) ×3 IMPLANT
CHLORAPREP W/TINT 26 (MISCELLANEOUS) ×3 IMPLANT
CNTNR URN SCR LID CUP LEK RST (MISCELLANEOUS) ×4 IMPLANT
CONT SPEC 4OZ STRL OR WHT (MISCELLANEOUS) ×2
COVER BACK TABLE 80X110 HD (DRAPES) IMPLANT
DECANTER SPIKE VIAL GLASS SM (MISCELLANEOUS) ×3 IMPLANT
DERMABOND ADVANCED (GAUZE/BANDAGES/DRESSINGS) ×2
DERMABOND ADVANCED .7 DNX12 (GAUZE/BANDAGES/DRESSINGS) ×2 IMPLANT
DEVICE CLOSURE PERCLS PRGLD 6F (VASCULAR PRODUCTS) ×4 IMPLANT
DRSG TEGADERM 4X4.75 (GAUZE/BANDAGES/DRESSINGS) ×6 IMPLANT
ELECT REM PT RETURN 9FT ADLT (ELECTROSURGICAL) ×3
ELECTRODE REM PT RTRN 9FT ADLT (ELECTROSURGICAL) ×2 IMPLANT
GAUZE 4X4 16PLY ~~LOC~~+RFID DBL (SPONGE) ×3 IMPLANT
GAUZE SPONGE 4X4 12PLY STRL (GAUZE/BANDAGES/DRESSINGS) ×3 IMPLANT
GLOVE BIO SURGEON STRL SZ7.5 (GLOVE) IMPLANT
GLOVE BIO SURGEON STRL SZ8 (GLOVE) IMPLANT
GLOVE ORTHO TXT STRL SZ7.5 (GLOVE) IMPLANT
GLOVE SURG MICRO LTX SZ7 (GLOVE) IMPLANT
GOWN STRL REUS W/ TWL LRG LVL3 (GOWN DISPOSABLE) IMPLANT
GOWN STRL REUS W/ TWL XL LVL3 (GOWN DISPOSABLE) ×2 IMPLANT
GOWN STRL REUS W/TWL LRG LVL3 (GOWN DISPOSABLE)
GOWN STRL REUS W/TWL XL LVL3 (GOWN DISPOSABLE) ×1
GUIDEWIRE SAF TJ AMPL .035X180 (WIRE) ×3 IMPLANT
GUIDEWIRE SAFE TJ AMPLATZ EXST (WIRE) ×3 IMPLANT
KIT BASIN OR (CUSTOM PROCEDURE TRAY) ×3 IMPLANT
KIT HEART LEFT (KITS) ×3 IMPLANT
KIT SAPIAN 3 ULTRA RESILIA 26 (Valve) ×1 IMPLANT
KIT TURNOVER KIT B (KITS) ×3 IMPLANT
NS IRRIG 1000ML POUR BTL (IV SOLUTION) ×3 IMPLANT
PACK ENDO MINOR (CUSTOM PROCEDURE TRAY) ×3 IMPLANT
PAD ARMBOARD 7.5X6 YLW CONV (MISCELLANEOUS) ×6 IMPLANT
PAD ELECT DEFIB RADIOL ZOLL (MISCELLANEOUS) ×3 IMPLANT
PERCLOSE PROGLIDE 6F (VASCULAR PRODUCTS) ×6
POSITIONER HEAD DONUT 9IN (MISCELLANEOUS) ×3 IMPLANT
SET MICROPUNCTURE 5F STIFF (MISCELLANEOUS) ×3 IMPLANT
SHEATH BRITE TIP 7FR 35CM (SHEATH) ×3 IMPLANT
SHEATH PINNACLE 6F 10CM (SHEATH) ×3 IMPLANT
SHEATH PINNACLE 8F 10CM (SHEATH) ×3 IMPLANT
SLEEVE REPOSITIONING LENGTH 30 (MISCELLANEOUS) ×3 IMPLANT
SPIKE FLUID TRANSFER (MISCELLANEOUS) ×1 IMPLANT
SPONGE T-LAP 18X18 ~~LOC~~+RFID (SPONGE) ×12 IMPLANT
SPONGE T-LAP 4X18 ~~LOC~~+RFID (SPONGE) ×3 IMPLANT
STOPCOCK MORSE 400PSI 3WAY (MISCELLANEOUS) ×6 IMPLANT
SUT PROLENE 6 0 C 1 30 (SUTURE) IMPLANT
SUT SILK  1 MH (SUTURE) ×1
SUT SILK 1 MH (SUTURE) ×2 IMPLANT
SYR 50ML LL SCALE MARK (SYRINGE) ×3 IMPLANT
SYR BULB IRRIG 60ML STRL (SYRINGE) IMPLANT
TOWEL GREEN STERILE (TOWEL DISPOSABLE) ×3 IMPLANT
TOWEL GREEN STERILE FF (TOWEL DISPOSABLE) ×3 IMPLANT
TRANSDUCER W/STOPCOCK (MISCELLANEOUS) ×6 IMPLANT
TUBING ART PRESS 72  MALE/FEM (TUBING) ×1
TUBING ART PRESS 72 MALE/FEM (TUBING) ×2 IMPLANT
TUBING CIL FLEX 10 FLL-RA (TUBING) ×1 IMPLANT
WIRE EMERALD 3MM-J .035X150CM (WIRE) ×3 IMPLANT
WIRE EMERALD 3MM-J .035X260CM (WIRE) ×3 IMPLANT
WIRE EMERALD ST .035X260CM (WIRE) IMPLANT
WIRE TORQFLEX AUST .018X40CM (WIRE) ×1 IMPLANT

## 2021-05-27 NOTE — Progress Notes (Signed)
LOCATION: right RADIAL a-line removed ? ?DRESSING APPLIED: gauze with tegaderm ? ?SITE UPON ARRIVAL: LEVEL 0 ? ?SITE AFTER BAND REMOVAL: LEVEL 0 ? ?CIRCULATION SENSATION AND MOVEMENT: + movement, +2 radial pulse ? ?COMMENTS:  Katie, PA made aware of pt's K= level, no orders obtained at this time ?

## 2021-05-27 NOTE — Anesthesia Procedure Notes (Signed)
Arterial Line Insertion ?Start/End4/25/2023 11:00 AM, 05/27/2021 11:07 AM ?Performed by: Barrington Ellison, CRNA ? Preanesthetic checklist: patient identified, risks and benefits discussed and pre-op evaluation ?Lidocaine 1% used for infiltration ?Right, radial was placed ?Catheter size: 20 G ?Hand hygiene performed  and maximum sterile barriers used  ?Allen's test indicative of satisfactory collateral circulation ?Attempts: 2 ?Procedure performed without using ultrasound guided technique. ?Following insertion, dressing applied and Biopatch. ?Post procedure assessment: normal ? ?Patient tolerated the procedure well with no immediate complications. ? ? ?

## 2021-05-27 NOTE — Progress Notes (Signed)
?  Echocardiogram ?2D Echocardiogram has been performed. ? Frank Mckee ?05/27/2021, 1:25 PM ?

## 2021-05-27 NOTE — Transfer of Care (Signed)
Immediate Anesthesia Transfer of Care Note ? ?Patient: Frank Mckee ? ?Procedure(s) Performed: Transcatheter Aortic Valve Replacement, Transfemoral (Chest) ?INTRAOPERATIVE TRANSTHORACIC ECHOCARDIOGRAM (Chest) ?ULTRASOUND GUIDANCE FOR VASCULAR ACCESS (Bilateral: Groin) ? ?Patient Location: Cath Lab ? ?Anesthesia Type:MAC ? ?Level of Consciousness: awake, alert  and oriented ? ?Airway & Oxygen Therapy: Patient Spontanous Breathing ? ?Post-op Assessment: Report given to RN ? ?Post vital signs: Reviewed and stable ? ?Last Vitals:  ?Vitals Value Taken Time  ?BP 107/51   ?Temp    ?Pulse 87   ?Resp 14   ?SpO2 93   ? ? ?Last Pain:  ?Vitals:  ? 05/27/21 0911  ?PainSc: 0-No pain  ?   ? ?  ? ?Complications: No notable events documented. ?

## 2021-05-27 NOTE — Interval H&P Note (Signed)
History and Physical Interval Note: ? ?05/27/2021 ?10:01 AM ? ?Frank Mckee  has presented today for surgery, with the diagnosis of Severe Aortic Stenosis.  The various methods of treatment have been discussed with the patient and family. After consideration of risks, benefits and other options for treatment, the patient has consented to  Procedure(s): ?Transcatheter Aortic Valve Replacement, Transfemoral (N/A) ?INTRAOPERATIVE TRANSTHORACIC ECHOCARDIOGRAM (N/A) as a surgical intervention.  The patient's history has been reviewed, patient examined, no change in status, stable for surgery.  I have reviewed the patient's chart and labs.  Questions were answered to the patient's satisfaction.   ? ? ?Gaye Pollack ? ? ?

## 2021-05-27 NOTE — Op Note (Signed)
[facility]  ?TAVR OPERATIVE NOTE ? ? ?Date of Procedure:  05/27/2021 ? ?Preoperative Diagnosis: Severe Aortic Stenosis  ? ?Postoperative Diagnosis: Same  ? ?Procedure:  ? ?Transcatheter Aortic Valve Replacement - Transfemoral Approach ? Edwards Sapien 3 Resilia THV (size 29m, model # [id number] serial # [id number] ?  ?Co-Surgeons:   BGaye Pollack MD and ALenna Sciara MD ?Anesthesiologist:  WRoderic Palau MD  ? ?Echocardiographer:  MRenee Harder MD ? ?Pre-operative Echo Findings: ?Severe aortic stenosis ?Normal left ventricular systolic function ? ?Post-operative Echo Findings: ?No paravalvular leak ?Normal left ventricular systolic function ? ?Left Heart Catheterization Findings: ?Left ventricular end-diastolic pressure of 13 mmHg ? ? ?BRIEF CLINICAL NOTE AND INDICATIONS FOR SURGERY ? ?The patient is a 71year old male with history of type 1 diabetes, hyperlipidemia, hypertension, and Graves' disease complicated by ophthalmopathy was evaluated in the outpatient setting due to severe aortic stenosis.  Due to the fact that he requires rather urgent ENT procedure under general anesthesia that may be prolonged in duration he was referred for aortic valve intervention. ? ?During the course of the patient's preoperative work up they have been evaluated comprehensively by a multidisciplinary team of specialists [facility].  They have been demonstrated to suffer from symptomatic severe aortic stenosis as noted above. The patient has been counseled extensively as to the relative risks and benefits of all options for the treatment of severe aortic stenosis including long term medical therapy, conventional surgery for aortic valve replacement, and transcatheter aortic valve replacement.  The patient has been independently evaluated by Dr. BCyndia Bentwith CT surgery and they are felt to be at high risk for  conventional surgical aortic valve replacement. The surgeon indicated the patient would be a poor candidate for conventional surgery. Based upon review of all of the patient's preoperative diagnostic tests they are felt to be candidate for transcatheter aortic valve replacement using the transfemoral approach as an alternative to high risk conventional surgery.   ? ?Following the decision to proceed with transcatheter aortic valve replacement, a discussion has been held regarding what types of management strategies would be attempted intraoperatively in the event of life-threatening complications, including whether or not the patient would be considered a candidate for the use of cardiopulmonary bypass and/or conversion to open sternotomy for attempted surgical intervention.  The patient has been advised of a variety of complications that might develop peculiar to this approach including but not limited to risks of death, stroke, paravalvular leak, aortic dissection or other major vascular complications, aortic annulus rupture, device embolization, cardiac rupture or perforation, acute myocardial infarction, arrhythmia, heart block or bradycardia requiring permanent pacemaker placement, congestive heart failure, respiratory failure, renal failure, pneumonia, infection, other late complications related to structural valve deterioration or migration, or other complications that might ultimately cause a temporary or permanent loss of functional independence or other long term morbidity.  The patient provides full informed consent for the procedure as described and all questions were answered preoperatively. ? ? ? ?DETAILS OF THE OPERATIVE PROCEDURE ? ?PREPARATION:   ?The patient is brought to the operating room on the above mentioned date and central monitoring was established by the anesthesia team including placement of a radial arterial line. The patient is placed in the supine position on the operating table.   Intravenous antibiotics are administered. Conscious sedation is used.  ? ?Baseline transthoracic echocardiogram was performed. The patient's chest, abdomen, both groins, and both  lower extremities are prepared and draped in a sterile manner. A time out procedure is performed. ? ? ?PERIPHERAL ACCESS:   ?Using the modified Seldinger technique, femoral arterial and venous access were obtained with placement of a 6 Fr sheath in the artery and a 7 Fr sheath in the vein on the left and right side respectively using u/s guidance.  A pigtail diagnostic catheter was passed through the femoral arterial sheath under fluoroscopic guidance into the aortic root.  Aortic root angiography was performed in order to determine the optimal angiographic angle for valve deployment. ? ?TRANSFEMORAL ACCESS:  ?A micropuncture kit was used to gain access to the right femoral artery using u/s guidance. Position confirmed with angiography. Pre-closure with double ProGlide closure devices. The patient was heparinized systemically and ACT verified > 250 seconds.   ? ?A 14 Fr transfemoral E-sheath was introduced into the right femoral artery after progressively dilating over an Amplatz superstiff wire. An AL-2 catheter was used to direct a straight-tip exchange length wire across the native aortic valve into the left ventricle. This was exchanged out for a pigtail catheter and position was confirmed in the LV apex. Simultaneous left ventricular, aortic, and left ventricular end-diastolic pressures were recorded.  The pigtail catheter was then exchanged for an Safari wire in the LV apex.  Direct LV pacing thresholds were assessed and found to be adequate.  ? ?TRANSCATHETER HEART VALVE DEPLOYMENT:  ?An Edwards Sapien 3 THV (size 26 mm) was prepared and crimped per manufacturer's guidelines, and the proper orientation of the valve is confirmed on the [employer] delivery system. The valve was advanced through the introducer sheath using normal  technique until in an appropriate position in the abdominal aorta beyond the sheath tip. The balloon was then retracted and using the fine-tuning wheel was centered on the valve. The valve was then advanced across the aortic arch using appropriate flexion of the catheter. The valve was carefully positioned across the aortic valve annulus. The Commander catheter was retracted using normal technique. Once final position of the valve has been confirmed by angiographic assessment, the valve is deployed while temporarily holding ventilation and during rapid ventricular pacing to maintain systolic blood pressure < 50 mmHg and pulse pressure < 10 mmHg. The balloon inflation is held for >3 seconds after reaching full deployment volume. Once the balloon has fully deflated the balloon is retracted into the ascending aorta and valve function is assessed using TTE. There is felt to be no paravalvular leak and no central aortic insufficiency.  The patient's hemodynamic recovery following valve deployment is good.  The deployment balloon and guidewire are both removed. Echo demostrated acceptable post-procedural gradients, stable mitral valve function, and no AI.  ? ?PROCEDURE COMPLETION:  ?The sheath was then removed and closure devices were completed. Protamine was administered once femoral arterial repair was complete. The temporary pacemaker, pigtail catheters and femoral sheaths were removed with a Perclose closure device placed in the artery and manual pressure used for venous hemostasis.   ? ?The patient tolerated the procedure well and is transported to the surgical intensive care in stable condition. There were no immediate intraoperative complications. All sponge instrument and needle counts are verified correct at completion of the operation.  ? ?No blood products were administered during the operation. ? ?The patient received a total of 66 mL of intravenous contrast during the procedure. ? ?Early Osmond  MD ?05/27/2021 ?1:48 PM ? ? ? ? ?

## 2021-05-27 NOTE — Op Note (Signed)
?[facility]   ?MULTIDISCIPLINARY HEART VALVE TEAM ? ? ?TAVR OPERATIVE NOTE ? ? ?Date of Procedure:  05/27/2021 ? ?Preoperative Diagnosis: Severe Aortic Stenosis  ? ?Postoperative Diagnosis: Same  ? ?Procedure:  ? ?Transcatheter Aortic Valve Replacement - Percutaneous Right Transfemoral Approach ? Edwards Sapien 3 Ultra Resilia THV (size 26 mm, model # 9755RSL, serial # [id number]) ?  ?Co-Surgeons:  Gaye Pollack, MD and Lenna Sciara, MD ? ?Anesthesiologist:  Therisa Doyne, MD ? ?Echocardiographer:  Cathe Mons, MD ? ?Pre-operative Echo Findings: ?Severe aortic stenosis ?Normal left ventricular systolic function ? ?Post-operative Echo Findings: ?No paravalvular leak ?Normal left ventricular systolic function ? ? ?BRIEF CLINICAL NOTE AND INDICATIONS FOR SURGERY ? ?This 71 year old gentleman has stage C, asymptomatic, severe to critical aortic stenosis.  His most recent echocardiogram shows a mean gradient of 42.5 mmHg and his peak to peak gradient at catheterization was 80 mmHg.  It is surprising that he is asymptomatic but I do not think that will last very long.  He performs very strenuous work and could certainly have some sudden collapse.  His cardiac catheterization shows no coronary disease.  I think it would be best to proceed with aortic valve replacement to prevent progressive left ventricular deterioration and sudden death related to critical aortic stenosis with vigorous physical activity.  It is also possible he will require surgical intervention for his eye disease.  Given his age and Berenice Primas' disease with ophthalmopathy I think it would be reasonable to proceed with transcatheter aortic valve replacement.  His gated cardiac CTA shows anatomy suitable for TAVR using a SAPIEN 3 valve.  His abdominal and pelvic CTA shows adequate pelvic vascular anatomy to allow transfemoral insertion. ?  ?The patient and his wife were counseled at length regarding treatment alternatives for management of  severe symptomatic aortic stenosis. The risks and benefits of surgical intervention has been discussed in detail. Long-term prognosis with medical therapy was discussed. Alternative approaches such as conventional surgical aortic valve replacement, transcatheter aortic valve replacement, and palliative medical therapy were compared and contrasted at length. This discussion was placed in the context of the patient's own specific clinical presentation and past medical history. All of their questions have been addressed.  ?  ?Following the decision to proceed with transcatheter aortic valve replacement, a discussion was held regarding what types of management strategies would be attempted intraoperatively in the event of life-threatening complications, including whether or not the patient would be considered a candidate for the use of cardiopulmonary bypass and/or conversion to open sternotomy for attempted surgical intervention.  He is in excellent physical condition and would be a candidate for emergent sternotomy to manage any intraoperative complications.  The patient is aware of the fact that transient use of cardiopulmonary bypass may be necessary. The patient has been advised of a variety of complications that might develop including but not limited to risks of death, stroke, paravalvular leak, aortic dissection or other major vascular complications, aortic annulus rupture, device embolization, cardiac rupture or perforation, mitral regurgitation, acute myocardial infarction, arrhythmia, heart block or bradycardia requiring permanent pacemaker placement, congestive heart failure, respiratory failure, renal failure, pneumonia, infection, other late complications related to structural valve deterioration or migration, or other complications that might ultimately cause a temporary or permanent loss of functional independence or other long term morbidity. The patient provides full informed consent for the procedure  as described and all questions were answered.  ? ? ? ? ?DETAILS OF THE OPERATIVE PROCEDURE ? ?PREPARATION:   ? ?  The patient was brought to the operating room on the above mentioned date and appropriate monitoring was established by the anesthesia team. The patient was placed in the supine position on the operating table.  Intravenous antibiotics were administered. The patient was monitored closely throughout the procedure under conscious sedation.   ? ?Baseline transthoracic echocardiogram was performed. The patient's abdomen and both groins were prepped and draped in a sterile manner. A time out procedure was performed. ? ? ?PERIPHERAL ACCESS:   ? ?Using the modified Seldinger technique, femoral arterial  access was obtained with placement of a 6 Fr sheath on the left side.  A pigtail diagnostic catheter was passed through the left arterial sheath under fluoroscopic guidance into the aortic root. Aortic root angiography was performed in order to determine the optimal angiographic angle for valve deployment. A 7 Fr sheath was placed in the right femoral vein. ? ? ?TRANSFEMORAL ACCESS:  ? ?Percutaneous transfemoral access and sheath placement was performed using ultrasound guidance.  The right common femoral artery was cannulated using a micropuncture needle and appropriate location was verified using hand injection angiogram.  A pair of [employer] Perclose percutaneous closure devices were placed and a 6 French sheath replaced into the femoral artery.  The patient was heparinized systemically and ACT verified > 250 seconds.   ? ?A 14 Fr transfemoral E-sheath was introduced into the right common femoral artery after progressively dilating over an Amplatz superstiff wire. An AL-2 catheter was used to direct a straight-tip exchange length wire across the native aortic valve into the left ventricle. This was exchanged out for a pigtail catheter and position was confirmed in the LV apex. Simultaneous LV and Ao pressures were  recorded.  The pigtail catheter was exchanged for a Safari wire in the LV apex. Direct LV pacing threshold was checked through the [facility] wire and was satisfactory.  ? ? ?BALLOON AORTIC VALVULOPLASTY:  ? ?Not performed  ? ? ?TRANSCATHETER HEART VALVE DEPLOYMENT:  ? ?An Edwards Sapien 3 Ultra transcatheter heart valve (size 26 mm) was prepared and crimped per manufacturer's guidelines, and the proper orientation of the valve is confirmed on the [employer] delivery system. The valve was advanced through the introducer sheath using normal technique until in an appropriate position in the abdominal aorta beyond the sheath tip. The balloon was then retracted and using the fine-tuning wheel was centered on the valve. The valve was then advanced across the aortic arch using appropriate flexion of the catheter. The valve was carefully positioned across the aortic valve annulus. The Commander catheter was retracted using normal technique. Once final position of the valve has been confirmed by angiographic assessment, the valve is deployed while temporarily holding ventilation and during rapid ventricular pacing to maintain systolic blood pressure < 50 mmHg and pulse pressure < 10 mmHg. The balloon inflation is held for >3 seconds after reaching full deployment volume. Once the balloon has fully deflated the balloon is retracted into the ascending aorta and valve function is assessed using echocardiography. There is felt to be no paravalvular leak and no central aortic insufficiency.  The patient's hemodynamic recovery following valve deployment is good.  The deployment balloon and guidewire are both removed.  ? ? ?PROCEDURE COMPLETION:  ? ?The sheath was removed and femoral artery closure performed.  Protamine was administered once femoral arterial repair was complete. The temporary pacemaker, pigtail catheters and femoral sheaths were removed with manual pressure used for hemostasis.  A Perclose femoral closure  device was utilized  following removal of the diagnostic sheath in the left femoral artery. ? ?The patient tolerated the procedure well and is transported to the cath lab recovery area in stable condition.

## 2021-05-27 NOTE — Progress Notes (Signed)
?  [facility]   ?MULTIDISCIPLINARY HEART VALVE TEAM ? ?Patient doing well s/p TAVR. He is hemodynamically stable. Groin sites stable. ECG with sinus and no high grade block. Arterial line discontinued and transferred to 4E. Continue home insulin pump. Plan for early ambulation after bedrest completed and hopeful discharge over the next 24-48 hours.  ? ?Angelena Form PA-C  [facility]  ?Pager [phone] ? ?

## 2021-05-27 NOTE — Anesthesia Postprocedure Evaluation (Signed)
Anesthesia Post Note ? ?Patient: SATOSHI KALAS ? ?Procedure(s) Performed: Transcatheter Aortic Valve Replacement, Transfemoral (Chest) ?INTRAOPERATIVE TRANSTHORACIC ECHOCARDIOGRAM (Chest) ?ULTRASOUND GUIDANCE FOR VASCULAR ACCESS (Bilateral: Groin) ? ?  ? ?Patient location during evaluation: Cath Lab ?Anesthesia Type: MAC ?Level of consciousness: awake and alert ?Pain management: pain level controlled ?Vital Signs Assessment: post-procedure vital signs reviewed and stable ?Respiratory status: spontaneous breathing, nonlabored ventilation and respiratory function stable ?Cardiovascular status: stable and blood pressure returned to baseline ?Postop Assessment: no apparent nausea or vomiting ?Anesthetic complications: no ? ? ?No notable events documented. ? ?Last Vitals:  ?Vitals:  ? 05/27/21 1515 05/27/21 1520  ?BP: 108/65 105/61  ?Pulse: 64 60  ?Resp: 14 17  ?Temp:    ?SpO2: 96% 95%  ?  ?Last Pain:  ?Vitals:  ? 05/27/21 1431  ?TempSrc: Temporal  ?PainSc: 0-No pain  ? ? ?  ?  ?  ?  ?  ?  ? ?Madelein Mahadeo,W. EDMOND ? ? ? ? ?

## 2021-05-28 ENCOUNTER — Other Ambulatory Visit: Payer: Self-pay

## 2021-05-28 ENCOUNTER — Encounter (HOSPITAL_COMMUNITY): Payer: Self-pay | Admitting: Internal Medicine

## 2021-05-28 ENCOUNTER — Inpatient Hospital Stay (HOSPITAL_COMMUNITY): Payer: Medicare Other

## 2021-05-28 DIAGNOSIS — Z952 Presence of prosthetic heart valve: Secondary | ICD-10-CM

## 2021-05-28 LAB — ECHOCARDIOGRAM COMPLETE
AR max vel: 1.38 cm2
AV Area VTI: 1.6 cm2
AV Area mean vel: 1.47 cm2
AV Mean grad: 14 mmHg
AV Peak grad: 24 mmHg
Ao pk vel: 2.45 m/s
Area-P 1/2: 3.68 cm2
Calc EF: 61.5 %
Height: 69 in
S' Lateral: 3.3 cm
Single Plane A2C EF: 59.2 %
Single Plane A4C EF: 62.9 %
Weight: 3152 oz

## 2021-05-28 LAB — CBC
HCT: 38 % — ABNORMAL LOW (ref 39.0–52.0)
Hemoglobin: 12.7 g/dL — ABNORMAL LOW (ref 13.0–17.0)
MCH: 30.8 pg (ref 26.0–34.0)
MCHC: 33.4 g/dL (ref 30.0–36.0)
MCV: 92 fL (ref 80.0–100.0)
Platelets: 181 10*3/uL (ref 150–400)
RBC: 4.13 MIL/uL — ABNORMAL LOW (ref 4.22–5.81)
RDW: 15.1 % (ref 11.5–15.5)
WBC: 7.7 10*3/uL (ref 4.0–10.5)
nRBC: 0 % (ref 0.0–0.2)

## 2021-05-28 LAB — BASIC METABOLIC PANEL
Anion gap: 6 (ref 5–15)
BUN: 18 mg/dL (ref 8–23)
CO2: 26 mmol/L (ref 22–32)
Calcium: 8.3 mg/dL — ABNORMAL LOW (ref 8.9–10.3)
Chloride: 106 mmol/L (ref 98–111)
Creatinine, Ser: 1.05 mg/dL (ref 0.61–1.24)
GFR, Estimated: >60 mL/min (ref >60)
Glucose, Bld: 76 mg/dL (ref 70–99)
Potassium: 3.8 mmol/L (ref 3.5–5.1)
Sodium: 138 mmol/L (ref 135–145)

## 2021-05-28 LAB — GLUCOSE, CAPILLARY
Glucose-Capillary: 51 mg/dL — ABNORMAL LOW (ref 70–99)
Glucose-Capillary: 147 mg/dL — ABNORMAL HIGH (ref 70–99)
Glucose-Capillary: 155 mg/dL — ABNORMAL HIGH (ref 70–99)

## 2021-05-28 LAB — MAGNESIUM: Magnesium: 2 mg/dL (ref 1.7–2.4)

## 2021-05-28 MED ORDER — LOSARTAN POTASSIUM 50 MG PO TABS
50.0000 mg | ORAL_TABLET | Freq: Every day | ORAL | Status: DC
  Administered 2021-05-28: 50 mg via ORAL
  Filled 2021-05-28: qty 1

## 2021-05-28 MED FILL — Magnesium Sulfate Inj 50%: INTRAMUSCULAR | Qty: 10 | Status: AC

## 2021-05-28 MED FILL — Potassium Chloride Inj 2 mEq/ML: INTRAVENOUS | Qty: 20 | Status: AC

## 2021-05-28 MED FILL — Heparin Sodium (Porcine) Inj 1000 Unit/ML: Qty: 1000 | Status: AC

## 2021-05-28 NOTE — TOC Transition Note (Signed)
Transition of Care (TOC) - CM/SW Discharge Note ?Marvetta Gibbons Therapist, sports, BSN ?Transitions of Care ?Unit 4E- RN Case Manager ?See Treatment Team for direct phone #  ? ? ?Patient Details  ?Name: MATTTHEW ZIOMEK ?MRN: [id number] ?Date of Birth: Oct 03, 1950 ? ?Transition of Care (TOC) CM/SW Contact:  ?Dahlia Client, Romeo Rabon, RN ?Phone Number: ?05/28/2021, 11:36 AM ? ? ?Clinical Narrative:    ?Pt from home s/p TAVR. Stable for transition home today. Transition of Care Department [facility]) has reviewed patient and no TOC needs have been identified at this time.  ? ?Final next level of care: Home/Self Care ?Barriers to Discharge: No Barriers Identified ? ? ?Patient Goals and CMS Choice ?  ?  ?Choice offered to / list presented to : NA ? ?Discharge Placement ?  ?           ? Home ?  ?  ?  ? ?Discharge Plan and Services ?  ?  ?           ?DME Arranged: N/A ?DME Agency: NA ?  ?  ?  ?HH Arranged: NA ?[place] Agency: NA ?  ?  ?  ? ?Social Determinants of Health (SDOH) Interventions ?  ? ? ?Readmission Risk Interventions ? ?  05/28/2021  ? 11:36 AM  ?Readmission Risk Prevention Plan  ?Post Dischage Appt Complete  ?Medication Screening Complete  ?Transportation Screening Complete  ? ? ? ? ? ?

## 2021-05-28 NOTE — Discharge Summary (Addendum)
?[facility]ASC[facility]ENTER   ?MULTIDISCIPLINARY HEART VALVE TEAM ? ?Discharge Summary  ?  ?Patient ID: Frank Mckee ?MRN: [id number]; DOB: May 28, 1950 ? ?Admit date: 05/27/2021 ?Discharge date: 05/28/2021 ? ?Primary Care Provider: Sofie Hartigan, MD  ?Primary Cardiologist: Ida Rogue, MD / Dr. Ali Lowe & Dr. Cyndia Bent (TAVR) ? ?Discharge Diagnoses  ?  ?Principal Problem: ?  S/P TAVR (transcatheter aortic valve replacement) ?Active Problems: ?  Severe aortic stenosis ?  Hypertension ?  Hypercholesteremia ?  Graves' eye disease ?  Graves disease ?  Diabetes mellitus without complication ([place]) ? ? ?Allergies ?Allergies  ?Allergen Reactions  ? Atorvastatin Other (See Comments)  ?  Muscle cramp  ? Ezetimibe Other (See Comments)  ?  Muscle cramps  ? ? ?Diagnostic Studies/Procedures  ?  ?TAVR OPERATIVE NOTE ?  ?  ?Date of Procedure:                05/27/2021 ?  ?Preoperative Diagnosis:      Severe Aortic Stenosis  ?  ?Postoperative Diagnosis:    Same  ?  ?Procedure:      ?  ?Transcatheter Aortic Valve Replacement - Percutaneous Right Transfemoral Approach ?            Edwards Sapien 3 Ultra Resilia THV (size 26 mm, model # 9755RSL, serial # [id number]) ?             ?Co-Surgeons:                        Gaye Pollack, MD and Lenna Sciara, MD ?  ?Anesthesiologist:                  Therisa Doyne, MD ?  ?Echocardiographer:              Cathe Mons, MD ?  ?Pre-operative Echo Findings: ?Severe aortic stenosis ?Normal left ventricular systolic function ?  ?Post-operative Echo Findings: ?No paravalvular leak ?Normal left ventricular systolic function ?  ?_____________ ?  ? ?Echo 05/28/21: completed but pending formal read at the time of discharge  ? ?History of Present Illness   ?  ?Frank Mckee is a 71 y.o. 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 Sharmaine Base @ [facility])- this was postponed due to severe AS and now stable on  steroids and Tepazza infusions, HTN, HLD, DMT1 on insulin pump, and severe aortic stenosis who presented to [facility] on 05/27/21 for planned TAVR.  ? ?He is a very active gentleman who continues to work daily doing heavy physical activities including heavy lifting without any symptoms.  He denies any shortness of breath or fatigue.  He denies chest discomfort.  He denies dizziness and syncope.  He has no orthopnea or peripheral edema. ? ?His ophthalmopathy from Graves' disease recently recurred and he has been treated with prednisone. It was felt that he may require endoscopic surgical decompression under general anesthesia.  He has known severe aortic stenosis by echocardiogram most recently on 02/11/2021 EF 60-65%, mean grad 42.32mHg, peak grad 64.148mg, AVA 0.98cm2, DVI 0.26, SVI 51. The plan was to continue following his aortic stenosis since he was completely asymptomatic but now since he may require general anesthesia it was felt that he should be evaluated for TAVR to safely allow him to undergo general anesthesia.  He underwent cardiac catheterization on 05/01/2021 showing severe aortic stenosis with a peak to peak gradient of  80 mmHg. Filling pressures and cardiac output were normal. Coronary arteries were normal. ? ?The patient has been evaluated by the multidisciplinary valve team and felt to have severe, symptomatic aortic stenosis and to be a suitable candidate for TAVR, which was set up for 05/27/21.  ? ?Hospital Course  ?   ?Consultants: none  ? ?Severe AS: s/p successful TAVR with a 26 mm Edwards Sapien 3 Ultra Resilia THV via the TF approach on 05/27/21. Post operative echo pending. Groin sites are stable. ECG with sinus and no high grade heart block. Continue Asprin alone. Plan for discharge home today with close follow up in the office next week.  ? ?DMT1: he continued his home insulin pump ? ?Graves disease with eye involvement: continue on methimazole, prednisone and Tepazza. Of note, he was recently  started on intermittent infusions of Tepezza every 3 weeks for his ophthalmopathy which has significantly improved it and he may not require surgical decompression. ? ?HTN: BP elevated in the setting of holding Losartan. This has been added back.  Will continue to follow.  ? ?Incidental finding: noted on pre TAVR CT. ? ?Pulmonary nodules: clustered indistinct basilar right lower lobe pulmonary nodules, largest 0.7 cm , new from 12/07/2014 chest CT. Non-contrast chest CT at 3-6 months is recommended. If nodules persist, subsequent management will be based upon the most suspicious nodule(s). This will be discussed in the outpatient setting.  ? ?Possible cirrhosis: finely irregular liver surface, suggesting cirrhosis. No suspicious liver masses. Recommend correlation with liver function tests. PAT labs showed normal LFTs. No further work up required.  ? ?_____________ ? ?Discharge Vitals ?Blood pressure (!) 167/67, pulse 69, temperature 98.8 ?F (37.1 ?C), temperature source Oral, resp. rate 17, height '5\' 9"'$  (1.753 m), weight 89.4 kg, SpO2 97 %.  ?Filed Weights  ? 05/27/21 0856 05/28/21 0321  ?Weight: 85.7 kg 89.4 kg  ? ? ? ?GEN: Well nourished, well developed, in no acute distress ?HEENT: normal ?Neck: no JVD or masses ?Cardiac: RRR; very soft flow murmurs @ RUSB. No rubs, or gallops,no edema  ?Respiratory:  clear to auscultation bilaterally, normal work of breathing ?GI: soft, nontender, nondistended, + BS ?MS: no deformity or atrophy ?Skin: warm and dry, no rash.  Groin sites clear without hematoma. Right side with ecchymosis  ?Neuro:  Alert and Oriented x 3, Strength and sensation are intact ?Psych: euthymic mood, full affect ? ? ?Labs & Radiologic Studies  ?  ?CBC ?Recent Labs  ?  05/27/21 ?1412 05/28/21 ?0145  ?WBC  --  7.7  ?HGB 12.9* 12.7*  ?HCT 38.0* 38.0*  ?MCV  --  92.0  ?PLT  --  181  ? ?Basic Metabolic Panel ?Recent Labs  ?  05/27/21 ?1412 05/28/21 ?0145  ?NA 141 138  ?K 3.5 3.8  ?CL 104 106  ?CO2  --  26   ?GLUCOSE 82 76  ?BUN 21 18  ?CREATININE 1.00 1.05  ?CALCIUM  --  8.3*  ?MG  --  2.0  ? ?Liver Function Tests ?No results for input(s): AST, ALT, ALKPHOS, BILITOT, PROT, ALBUMIN in the last 72 hours. ?No results for input(s): LIPASE, AMYLASE in the last 72 hours. ?Cardiac Enzymes ?No results for input(s): CKTOTAL, CKMB, CKMBINDEX, TROPONINI in the last 72 hours. ?BNP ?Invalid input(s): POCBNP ?D-Dimer ?No results for input(s): DDIMER in the last 72 hours. ?Hemoglobin A1C ?No results for input(s): HGBA1C in the last 72 hours. ?Fasting Lipid Panel ?No results for input(s): CHOL, HDL, LDLCALC, TRIG, CHOLHDL, LDLDIRECT in the  last 72 hours. ?Thyroid Function Tests ?No results for input(s): TSH, T4TOTAL, T3FREE, THYROIDAB in the last 72 hours. ? ?Invalid input(s): FREET3 ?_____________  ?DG Chest 2 View ? ?Result Date: 05/26/2021 ?CLINICAL DATA:  Severe aortic stenosis. EXAM: CHEST - 2 VIEW COMPARISON:  CT May 20, 2021. FINDINGS: The heart size and mediastinal contours are within normal limits. No focal consolidation. No pleural effusion. No pneumothorax. The visualized skeletal structures are unremarkable. IMPRESSION: No acute process in the chest. Electronically Signed   By: Dahlia Bailiff M.D.   On: 05/26/2021 08:38  ? ?CARDIAC CATHETERIZATION ? ?Result Date: 05/01/2021 ?  There is severe aortic valve stenosis. 1.  Normal left dominant circulation; the right coronary artery could not be engaged selectively however nonselective injection demonstrated this to be small and nondominant. 2.  Normal cardiac output and index with normal filling pressures 3.  Severe aortic stenosis with peak to peak gradient of 80 mmHg. 4.  Capacious iliofemoral vessels bilaterally.  ? ?PERIPHERAL VASCULAR CATHETERIZATION ? ?Result Date: 05/01/2021 ?  There is severe aortic valve stenosis. 1.  Normal left dominant circulation; the right coronary artery could not be engaged selectively however nonselective injection demonstrated this to be  small and nondominant. 2.  Normal cardiac output and index with normal filling pressures 3.  Severe aortic stenosis with peak to peak gradient of 80 mmHg. 4.  Capacious iliofemoral vessels bilaterally.  ? ?CT

## 2021-05-28 NOTE — Progress Notes (Signed)
05/28/2021 ?12:01 PM ?Discharge AVS meds taken today and those due this evening reviewed.  Follow-up appointments and when to call md reviewed.  D/C IV and TELE.  Questions and concerns addressed.   D/C home per orders.  ?Brion Aliment C ? ?

## 2021-05-28 NOTE — Progress Notes (Signed)
CARDIAC REHAB PHASE I  ? ?PRE:  Rate/Rhythm: 72 SR ? ?BP:  Sitting: 166/80     ? ?SaO2: 95 RA ? ?MODE:  Ambulation: 470 ft  ? ?POST:  Rate/Rhythm: 102 ST ? ?BP:  Sitting: 188/73 ? ?  SaO2: 97 RA ? ?Pt ambulated 440f in hallway independently without difficulty. Pt denies CP, SOB, or dizziness. Some bruising on groin sites noted. Pt and spouse educated on importance of site care, restrictions, and exercise guidelines. Pt declines CRP II at this time.  ? ?[id number]?TRufina Falco RN BSN ?05/28/2021 ?11:25 AM ? ?

## 2021-05-28 NOTE — Discharge Instructions (Signed)

## 2021-05-29 ENCOUNTER — Telehealth: Payer: Self-pay | Admitting: Physician Assistant

## 2021-05-29 NOTE — Telephone Encounter (Signed)
?  HEART AND VASCULAR CENTER   ?MULTIDISCIPLINARY HEART VALVE TEAM ? ? ?Patient contacted regarding discharge from [facility] on 4/26 ? ?Patient understands to follow up with a structural heart APP on 5/3 at [place].  ?Patient understands discharge instructions? yes ?Patient understands medications and regimen? yes ?Patient understands to bring all medications to this visit? yes ? ?Angelena Form PA-C  [facility]  ? ? ? ?

## 2021-06-03 NOTE — Progress Notes (Signed)
?HEART AND VASCULAR CENTER   ?[place] ?                                    ?Cardiology Office Note:   ? ?Date:  06/04/2021  ? ?ID:  SCHUYLER OLDEN, DOB 12/15/1950, MRN [id number] ? ?PCP:  Sofie Hartigan, MD  ?[facility] Cardiologist:  Ida Rogue, MD  / Dr. Ali Lowe & Dr. Cyndia Bent (TAVR) ?[place] Electrophysiologist:  None  ? ?Referring MD: Sofie Hartigan, MD  ? ?[facility] s/p TAVR ? ?History of Present Illness:   ? ?RONNY KORFF is a 71 y.o. male with a hx of Graves' disease on methimazole with Graves ophthalmopathy, HTN, HLD, DMT1 on insulin pump, and severe aortic stenosis s/p TAVR (05/27/21) who presents to clinic for follow up.  ? ?His ophthalmopathy from Graves' disease recently recurred and he has been treated with prednisone. It was felt that he may require endoscopic surgical decompression under general anesthesia.  He had known severe aortic stenosis by echocardiogram most recently on 02/11/2021 EF 60-65%, mean grad 42.55mmHg, peak grad 64.30mmHg, AVA 0.98cm2, DVI 0.26, SVI 51. The plan was to continue following his aortic stenosis since he was completely asymptomatic but now since he may require general anesthesia it was felt that he should be evaluated for TAVR to safely allow him to undergo general anesthesia.  He underwent cardiac catheterization on 05/01/2021 showing severe aortic stenosis with a peak to peak gradient of 80 mmHg. Filling pressures and cardiac output were normal. Coronary arteries were normal. ? ?The patient was evaluated by the multidisciplinary valve team and underwent successful TAVR with a 26 mm Edwards Sapien 3 Ultra Resilia THV via the TF approach on 05/27/21. Post operative echo showed EF 60%, normally functioning TAVR with a mean gradient of 14 mmHg and trivial PVL at the 2 o'clock. He was continued on aspirin alone.  ? ?Today the patient presents to clinic for follow up. Here with his wife. No CP or SOB. No LE edema, orthopnea or PND. No  dizziness or syncope. No blood in stool or urine. No palpitations.  ? ? ?Past Medical History:  ?Diagnosis Date  ? Diabetes mellitus without complication ([place])   ? Graves disease   ? Graves' eye disease   ? Hypercholesteremia   ? Hypertension   ? S/P TAVR (transcatheter aortic valve replacement) 05/27/2021  ? s/p TAVR with a 26 mm Edwards S3UR via the TF approach by Dr. Ali Lowe & Dr. Cyndia Bent  ? Severe aortic stenosis   ? ? ?Past Surgical History:  ?Procedure Laterality Date  ? ABDOMINAL AORTOGRAM N/A 05/01/2021  ? Procedure: ABDOMINAL AORTOGRAM;  Surgeon: Early Osmond, MD;  Location: [place] [facility];  Service: Cardiovascular;  Laterality: N/A;  ? arm surgery    ? BACK SURGERY    ? COLONOSCOPY WITH PROPOFOL N/A 02/17/2017  ? Procedure: COLONOSCOPY WITH PROPOFOL;  Surgeon: Toledo, Benay Pike, MD;  Location: [facility];  Service: Gastroenterology;  Laterality: N/A;  ? ENDOSCOPIC CONCHA BULLOSA RESECTION Right 05/30/2020  ? Procedure: ENDOSCOPIC CONCHA BULLOSA RESECTION;  Surgeon: Margaretha Sheffield, MD;  Location: [place];  Service: ENT;  Laterality: Right;  ? EXCISION NASAL MASS Left 05/30/2020  ? Procedure: EXCISION NASAL MASS;  Surgeon: Margaretha Sheffield, MD;  Location: [place];  Service: ENT;  Laterality: Left;  ? EYE SURGERY    ? cataract extraction  ?  INTRAOPERATIVE TRANSTHORACIC ECHOCARDIOGRAM N/A 05/27/2021  ? Procedure: INTRAOPERATIVE TRANSTHORACIC ECHOCARDIOGRAM;  Surgeon: Early Osmond, MD;  Location: [place];  Service: Open Heart Surgery;  Laterality: N/A;  ? NASAL SEPTOPLASTY W/ TURBINOPLASTY N/A 05/30/2020  ? Procedure: NASAL SEPTOPLASTY WITH TURBINATE REDUCTION;  Surgeon: Margaretha Sheffield, MD;  Location: [place];  Service: ENT;  Laterality: N/A;  ? RIGHT/LEFT HEART CATH AND CORONARY ANGIOGRAPHY N/A 05/01/2021  ? Procedure: RIGHT/LEFT HEART CATH AND CORONARY ANGIOGRAPHY;  Surgeon: Early Osmond, MD;  Location: [place] [facility];  Service: Cardiovascular;  Laterality: N/A;   ? SHOULDER SURGERY    ? TEE WITHOUT CARDIOVERSION N/A 03/01/2020  ? Procedure: TRANSESOPHAGEAL ECHOCARDIOGRAM (TEE);  Surgeon: Minna Merritts, MD;  Location: [facility];  Service: Cardiovascular;  Laterality: N/A;  ? TRANSCATHETER AORTIC VALVE REPLACEMENT, TRANSFEMORAL N/A 05/27/2021  ? Procedure: Transcatheter Aortic Valve Replacement, Transfemoral;  Surgeon: Early Osmond, MD;  Location: [place];  Service: Open Heart Surgery;  Laterality: N/A;  ? ULTRASOUND GUIDANCE FOR VASCULAR ACCESS Bilateral 05/27/2021  ? Procedure: ULTRASOUND GUIDANCE FOR VASCULAR ACCESS;  Surgeon: Early Osmond, MD;  Location: [place];  Service: Open Heart Surgery;  Laterality: Bilateral;  ? ? ?Current Medications: ?Current Meds  ?Medication Sig  ? amoxicillin (AMOXIL) 500 MG tablet Take 4 tablets (2,000 mg total) by mouth as directed. 1 hour prior to dental work including cleanings  ? aspirin EC 81 MG tablet Take 81 mg by mouth daily. Swallow whole.  ? EPINEPHrine (EPIPEN 2-PAK) 0.3 mg/0.3 mL IJ SOAJ injection Inject 0.3 mg into the muscle as needed for anaphylaxis.  ? fexofenadine (ALLEGRA) 180 MG tablet Take 180 mg by mouth daily.  ? insulin aspart (NOVOLOG) 100 UNIT/ML injection INJECT UP TO 60 UNITS UNDER THE SKIN VIA PUMP AS DIRECTED  ? Insulin Disposable Pump (OMNIPOD 5 G6 INTRO, GEN 5,) KIT Inject into the skin.  ? losartan (COZAAR) 50 MG tablet TAKE 1 TABLET(50 MG) BY MOUTH DAILY  ? methimazole (TAPAZOLE) 10 MG tablet Take 10-15 mg by mouth See admin instructions. Take 10 mg by mouth every other day, alternating with 15 mg on alternate days  ? Multiple Vitamin (MULTIVITAMIN) tablet Take 1 tablet by mouth daily.  ? predniSONE (DELTASONE) 10 MG tablet Take 10 mg by mouth in the morning and at bedtime.  ? rosuvastatin (CRESTOR) 5 MG tablet Take 5 mg by mouth daily.  ? tamsulosin (FLOMAX) 0.4 MG CAPS capsule Take 0.4 mg by mouth every evening.  ? teprotumumab-trbw (TEPEZZA) 500 MG injection Inject 500 mg into the vein every 21 (  twenty-one) days.  ? triamcinolone cream (KENALOG) 0.1 % Apply 1 application. topically daily as needed (Rash).  ?  ? ?Allergies:   Atorvastatin and Ezetimibe  ? ?Social History  ? ?Socioeconomic History  ? Marital status: Married  ?  Spouse name: Not on file  ? Number of children: Not on file  ? Years of education: Not on file  ? Highest education level: Not on file  ?Occupational History  ? Not on file  ?Tobacco Use  ? Smoking status: Never  ? Smokeless tobacco: Never  ?Vaping Use  ? Vaping Use: Never used  ?Substance and Sexual Activity  ? Alcohol use: No  ? Drug use: No  ? Sexual activity: Not on file  ?Other Topics Concern  ? Not on file  ?Social History Narrative  ? Not on file  ? ?Social Determinants of Health  ? ?Financial Resource Strain: Not on file  ?  Food Insecurity: Not on file  ?Transportation Needs: Not on file  ?Physical Activity: Not on file  ?Stress: Not on file  ?Social Connections: Not on file  ?  ? ?Family History: ?The patient's family history includes Diabetes in his father; Healthy in his mother; Hypertension in his father. ? ?ROS:   ?Please see the history of present illness.    ?All other systems reviewed and are negative. ? ?EKGs/Labs/Other Studies Reviewed:   ? ?The following studies were reviewed today: ? ?TAVR OPERATIVE NOTE ?  ?  ?Date of Procedure:                05/27/2021 ?  ?Preoperative Diagnosis:      Severe Aortic Stenosis  ?  ?Postoperative Diagnosis:    Same  ?  ?Procedure:      ?  ?Transcatheter Aortic Valve Replacement - Percutaneous Right Transfemoral Approach ?            Edwards Sapien 3 Ultra Resilia THV (size 26 mm, model # 9755RSL, serial # [id number]) ?             ?Co-Surgeons:                        Gaye Pollack, MD and Lenna Sciara, MD ?  ?Anesthesiologist:                  Therisa Doyne, MD ?  ?Echocardiographer:              Cathe Mons, MD ?  ?Pre-operative Echo Findings: ?Severe aortic stenosis ?Normal left ventricular systolic function ?  ?Post-operative  Echo Findings: ?No paravalvular leak ?Normal left ventricular systolic function ?  ?_____________ ?  ?  ?Echo 05/28/21: ?IMPRESSIONS  ? 1. The aortic valve has been replaced by a 26 mm Edwards Sapien valve.  ?Aor

## 2021-06-04 ENCOUNTER — Ambulatory Visit (INDEPENDENT_AMBULATORY_CARE_PROVIDER_SITE_OTHER): Payer: Medicare Other | Admitting: Physician Assistant

## 2021-06-04 VITALS — BP 120/60 | HR 61 | Ht 69.0 in | Wt 201.0 lb

## 2021-06-04 DIAGNOSIS — Z952 Presence of prosthetic heart valve: Secondary | ICD-10-CM

## 2021-06-04 DIAGNOSIS — E0500 Thyrotoxicosis with diffuse goiter without thyrotoxic crisis or storm: Secondary | ICD-10-CM | POA: Diagnosis not present

## 2021-06-04 DIAGNOSIS — I10 Essential (primary) hypertension: Secondary | ICD-10-CM | POA: Diagnosis not present

## 2021-06-04 DIAGNOSIS — R918 Other nonspecific abnormal finding of lung field: Secondary | ICD-10-CM

## 2021-06-04 MED ORDER — AMOXICILLIN 500 MG PO TABS: 2000.0000 mg | ORAL_TABLET | ORAL | 12 refills | Status: DC

## 2021-06-04 NOTE — Patient Instructions (Signed)
Medication Instructions:  ?Start, Amoxicillin 500 mg, take 4 tablets by mouth 1 hour before dental procedures and cleanings  ? ?*If you need a refill on your cardiac medications before your next appointment, please call your pharmacy* ? ? ?Lab Work: ?None ordered  ? ?If you have labs (blood work) drawn today and your tests are completely normal, you will receive your results only by: ?MyChart Message (if you have MyChart) OR ?A paper copy in the mail ?If you have any lab test that is abnormal or we need to change your treatment, we will call you to review the results. ? ? ?Testing/Procedures: ?None ordered  ? ? ?Follow-Up: ?Follow up as scheduled ? ?Other Instructions ? ? ?Important Information About Sugar ? ? ? ? ?  ?

## 2021-06-18 ENCOUNTER — Encounter: Payer: Medicare Other | Admitting: Surgery

## 2021-06-27 ENCOUNTER — Ambulatory Visit (HOSPITAL_COMMUNITY): Payer: Medicare Other | Attending: Internal Medicine

## 2021-06-27 ENCOUNTER — Ambulatory Visit (INDEPENDENT_AMBULATORY_CARE_PROVIDER_SITE_OTHER): Payer: Medicare Other | Admitting: Physician Assistant

## 2021-06-27 VITALS — BP 130/70 | HR 61 | Ht 69.0 in | Wt 206.6 lb

## 2021-06-27 DIAGNOSIS — R918 Other nonspecific abnormal finding of lung field: Secondary | ICD-10-CM | POA: Diagnosis not present

## 2021-06-27 DIAGNOSIS — Z952 Presence of prosthetic heart valve: Secondary | ICD-10-CM

## 2021-06-27 DIAGNOSIS — I10 Essential (primary) hypertension: Secondary | ICD-10-CM

## 2021-06-27 DIAGNOSIS — E0500 Thyrotoxicosis with diffuse goiter without thyrotoxic crisis or storm: Secondary | ICD-10-CM

## 2021-06-27 LAB — ECHOCARDIOGRAM COMPLETE
AR max vel: 1.11 cm2
AV Area VTI: 1.19 cm2
AV Area mean vel: 1.19 cm2
AV Mean grad: 16 mmHg
AV Peak grad: 33.6 mmHg
Ao pk vel: 2.9 m/s
Area-P 1/2: 2.27 cm2
S' Lateral: 3.4 cm

## 2021-06-27 NOTE — Progress Notes (Addendum)
[facility] [place]                                     Cardiology Office Note:    Date:  06/27/2021   ID:  MARIO CORONADO, DOB August 10, 1950, MRN [id number]  PCP:  Sofie Hartigan, MD  [facility] Cardiologist:  Ida Rogue, MD  / Dr. Ali Lowe & Dr. Cyndia Bent (TAVR) [facility] Electrophysiologist:  None   Referring MD: Sofie Hartigan, MD   1 month s/p TAVR  History of Present Illness:    Frank Mckee is a 71 y.o. male with a hx of Graves' disease on methimazole with Graves ophthalmopathy, HTN, HLD, DMT1 on insulin pump, and severe aortic stenosis s/p TAVR (05/27/21) who presents to clinic for follow up.   His ophthalmopathy from Graves' disease recently recurred and he has been treated with prednisone. It was felt that he may require endoscopic surgical decompression under general anesthesia.  He had known severe aortic stenosis by echocardiogram most recently on 02/11/2021 EF 60-65%, mean grad 42.48mHg, peak grad 64.131mg, AVA 0.98cm2, DVI 0.26, SVI 51. The plan was to continue following his aortic stenosis since he was completely asymptomatic but now since he may require general anesthesia it was felt that he should be evaluated for TAVR to safely allow him to undergo general anesthesia.  He underwent cardiac catheterization on 05/01/2021 showing severe aortic stenosis with a peak to peak gradient of 80 mmHg. Filling pressures and cardiac output were normal. Coronary arteries were normal.  The patient was evaluated by the multidisciplinary valve team and underwent successful TAVR with a 26 mm Edwards Sapien 3 Ultra Resilia THV via the TF approach on 05/27/21. Post operative echo showed EF 60%, normally functioning TAVR with a mean gradient of 14 mmHg and trivial PVL at the 2 o'clock. He was continued on aspirin alone.   Today the patient presents to clinic for follow up. No CP or SOB. No LE edema, orthopnea or PND. No dizziness or  syncope. No blood in stool or urine. No palpitations. He never had any symptoms to start with and still feels good.    Past Medical History:  Diagnosis Date   Diabetes mellitus without complication ([place]   Graves disease    Graves' eye disease    Hypercholesteremia    Hypertension    S/P TAVR (transcatheter aortic valve replacement) 05/27/2021   s/p TAVR with a 26 mm Edwards S3UR via the TF approach by Dr. ThAli Lowe Dr. BaCyndia Bent Severe aortic stenosis     Past Surgical History:  Procedure Laterality Date   ABDOMINAL AORTOGRAM N/A 05/01/2021   Procedure: ABDOMINAL AORTOGRAM;  Surgeon: ThEarly OsmondMD;  [place] [facility];  Service: Cardiovascular;  Laterality: N/A;   arm surgery     BACK SURGERY     COLONOSCOPY WITH PROPOFOL N/A 02/17/2017   Procedure: COLONOSCOPY WITH PROPOFOL;  Surgeon: Toledo, TeBenay PikeMD;  Location: [facility] ENDOSCOPY;  Service: Gastroenterology;  Laterality: N/A;   ENDOSCOPIC CONCHA BULLOSA RESECTION Right 05/30/2020   Procedure: ENDOSCOPIC CONCHA BULLOSA RESECTION;  Surgeon: JuMargaretha SheffieldMD;  [place] Service: ENT;  Laterality: Right;   EXCISION NASAL MASS Left 05/30/2020   Procedure: EXCISION NASAL MASS;  Surgeon: JuMargaretha SheffieldMD;  [place] Service: ENT;  Laterality: Left;  EYE SURGERY     cataract extraction   INTRAOPERATIVE TRANSTHORACIC ECHOCARDIOGRAM N/A 05/27/2021   Procedure: INTRAOPERATIVE TRANSTHORACIC ECHOCARDIOGRAM;  Surgeon: Early Osmond, MD;  Location: [place];  Service: Open Heart Surgery;  Laterality: N/A;   NASAL SEPTOPLASTY W/ TURBINOPLASTY N/A 05/30/2020   Procedure: NASAL SEPTOPLASTY WITH TURBINATE REDUCTION;  Surgeon: Margaretha Sheffield, MD;  Location: [place];  Service: ENT;  Laterality: N/A;   RIGHT/LEFT HEART CATH AND CORONARY ANGIOGRAPHY N/A 05/01/2021   Procedure: RIGHT/LEFT HEART CATH AND CORONARY ANGIOGRAPHY;  Surgeon: Early Osmond, MD;  Location: [place] CV  LAB;  Service: Cardiovascular;  Laterality: N/A;   SHOULDER SURGERY     TEE WITHOUT CARDIOVERSION N/A 03/01/2020   Procedure: TRANSESOPHAGEAL ECHOCARDIOGRAM (TEE);  Surgeon: Minna Merritts, MD;  Location: [facility] ORS;  Service: Cardiovascular;  Laterality: N/A;   TRANSCATHETER AORTIC VALVE REPLACEMENT, TRANSFEMORAL N/A 05/27/2021   Procedure: Transcatheter Aortic Valve Replacement, Transfemoral;  Surgeon: Early Osmond, MD;  Location: [place];  Service: Open Heart Surgery;  Laterality: N/A;   ULTRASOUND GUIDANCE FOR VASCULAR ACCESS Bilateral 05/27/2021   Procedure: ULTRASOUND GUIDANCE FOR VASCULAR ACCESS;  Surgeon: Early Osmond, MD;  Location: [place];  Service: Open Heart Surgery;  Laterality: Bilateral;    Current Medications: Current Meds  Medication Sig   amoxicillin (AMOXIL) 500 MG tablet Take 4 tablets (2,000 mg total) by mouth as directed. 1 hour prior to dental work including cleanings   aspirin EC 81 MG tablet Take 81 mg by mouth daily. Swallow whole.   EPINEPHrine (EPIPEN 2-PAK) 0.3 mg/0.3 mL IJ SOAJ injection Inject 0.3 mg into the muscle as needed for anaphylaxis.   fexofenadine (ALLEGRA) 180 MG tablet Take 180 mg by mouth daily.   insulin aspart (NOVOLOG) 100 UNIT/ML injection INJECT UP TO 60 UNITS UNDER THE SKIN VIA PUMP AS DIRECTED   Insulin Disposable Pump (OMNIPOD 5 G6 INTRO, GEN 5,) KIT Inject into the skin.   losartan (COZAAR) 50 MG tablet TAKE 1 TABLET(50 MG) BY MOUTH DAILY   methimazole (TAPAZOLE) 10 MG tablet Take 10-15 mg by mouth See admin instructions. Take 10 mg by mouth every other day, alternating with 15 mg on alternate days   Multiple Vitamin (MULTIVITAMIN) tablet Take 1 tablet by mouth daily.   rosuvastatin (CRESTOR) 5 MG tablet Take 5 mg by mouth daily.   tamsulosin (FLOMAX) 0.4 MG CAPS capsule Take 0.4 mg by mouth every evening.   teprotumumab-trbw (TEPEZZA) 500 MG injection Inject 500 mg into the vein every 21 ( twenty-one) days.   triamcinolone cream  (KENALOG) 0.1 % Apply 1 application. topically daily as needed (Rash).     Allergies:   Atorvastatin and Ezetimibe   Social History   Socioeconomic History   Marital status: Married    Spouse name: Not on file   Number of children: Not on file   Years of education: Not on file   Highest education level: Not on file  Occupational History   Not on file  Tobacco Use   Smoking status: Never   Smokeless tobacco: Never  Vaping Use   Vaping Use: Never used  Substance and Sexual Activity   Alcohol use: No   Drug use: No   Sexual activity: Not on file  Other Topics Concern   Not on file  Social History Narrative   Not on file   Social Determinants of Health   Financial Resource Strain: Not on file  Food Insecurity: Not on file  Transportation Needs:  Not on file  Physical Activity: Not on file  Stress: Not on file  Social Connections: Not on file     Family History: The patient's family history includes Diabetes in his father; Healthy in his mother; Hypertension in his father.  ROS:   Please see the history of present illness.    All other systems reviewed and are negative.  EKGs/Labs/Other Studies Reviewed:    The following studies were reviewed today:  TAVR OPERATIVE NOTE     Date of Procedure:                05/27/2021   Preoperative Diagnosis:      Severe Aortic Stenosis    Postoperative Diagnosis:    Same    Procedure:        Transcatheter Aortic Valve Replacement - Percutaneous Right Transfemoral Approach             Edwards Sapien 3 Ultra Resilia THV (size 26 mm, model # 9755RSL, serial # [id number])              Co-Surgeons:                        Gaye Pollack, MD and Lenna Sciara, MD   Anesthesiologist:                  Therisa Doyne, MD   Echocardiographer:              Cathe Mons, MD   Pre-operative Echo Findings: Severe aortic stenosis Normal left ventricular systolic function   Post-operative Echo Findings: No paravalvular  leak Normal left ventricular systolic function   _____________     Echo 05/28/21: IMPRESSIONS   1. The aortic valve has been replaced by a 26 mm Edwards Sapien valve.  Aortic valve regurgitation is trivial, and paravalular at the 2 O'clock  position. Aortic valve mean gradient measures 14.0 mmHg, peak gradient 26  mm Hg. Effective orifice area 1.61  cm2. DVI 0.42.   2. Left ventricular ejection fraction, by estimation, is 60 to 65%. The  left ventricle has normal function. The left ventricle has no regional  wall motion abnormalities. There is mild concentric left ventricular  hypertrophy. Left ventricular diastolic  parameters are consistent with Grade I diastolic dysfunction (impaired  relaxation).   3. Right ventricular systolic function is normal. The right ventricular  size is normal. Tricuspid regurgitation signal is inadequate for assessing  PA pressure.   4. Left atrial size was mildly dilated.   5. The mitral valve is abnormal. No evidence of mitral valve  regurgitation. No evidence of mitral stenosis. Moderate mitral annular  calcification.   6. The inferior vena cava is normal in size with greater than 50%  respiratory variability, suggesting right atrial pressure of 3 mmHg.   Comparison(s): Trivial paravalvular leak now present.   ______________________  Echo 06/27/21 IMPRESSIONS  1. 26 mm S3. Vmax 2.9 m/s, MG 16 mmHG, EOA 1.19 cm2, DI 0.31. Trivial paravalvular leak. All values within limits. The aortic valve has been repaired/replaced. Aortic valve regurgitation is trivial. Procedure Date: 05/27/2021. Echo findings are  consistent with normal structure and function of the aortic valve prosthesis.  2. Left ventricular ejection fraction, by estimation, is 55 to 60%. The left ventricle has normal function. The left ventricle has no regional wall motion abnormalities. There is mild concentric left ventricular hypertrophy. Left ventricular diastolic  parameters are  consistent with Grade I diastolic dysfunction (impaired  relaxation).  3. Right ventricular systolic function is normal. The right ventricular size is normal.  4. Left atrial size was mildly dilated.  5. The mitral valve is grossly normal. Trivial mitral valve regurgitation. No evidence of mitral stenosis.  6. The inferior vena cava is normal in size with greater than 50% respiratory variability, suggesting right atrial pressure of 3 mmHg.   Comparison(s): No significant change from prior study.  EKG:  EKG is NOT ordered today.    Recent Labs: 05/23/2021: ALT 30 05/28/2021: BUN 18; Creatinine, Ser 1.05; Hemoglobin 12.7; Magnesium 2.0; Platelets 181; Potassium 3.8; Sodium 138  Recent Lipid Panel No results found for: CHOL, TRIG, HDL, CHOLHDL, VLDL, LDLCALC, LDLDIRECT   Risk Assessment/Calculations:       Physical Exam:    VS:  BP 130/70   Pulse 61   Ht _0  (1.753 m)   Wt 206 lb 9.6 oz (93.7 kg)   SpO2 95%   BMI 30.51 kg/m     Wt Readings from Last 3 Encounters:  06/27/21 206 lb 9.6 oz (93.7 kg)  06/04/21 201 lb (91.2 kg)  05/28/21 197 lb (89.4 kg)     GEN:  Well nourished, well developed in no acute distress HEENT: Normal NECK: No JVD LYMPHATICS: No lymphadenopathy CARDIAC: RRR, soft flow murmur. no rubs, gallops RESPIRATORY:  Clear to auscultation without rales, wheezing or rhonchi  ABDOMEN: Soft, non-tender, non-distended MUSCULOSKELETAL:  No deformity.   SKIN: Warm and dry NEUROLOGIC:  Alert and oriented x 3 PSYCHIATRIC:  Normal affect   ASSESSMENT:    1. S/P TAVR (transcatheter aortic valve replacement)   2. Graves' ophthalmopathy   3. Essential hypertension   4. Pulmonary nodules     PLAN:    In order of problems listed above:  Severe AS s/p TAVR: echo today shows EF 55%, normally functioning TAVR with a mean gradient of 16 mm hg and trivial PVL. He has NYHA class I symptoms. SBE prophylaxis discussed; I have RX'd amoxicillin. Continue on aspirin alone.  I will see him back for 1 year follow up and echo.    Graves disease with eye involvement: continue on methimazole, prednisone and Tepazza. Of note, he was recently started on intermittent infusions of Tepezza every 3 weeks for his ophthalmopathy which has significantly improved it and he may not require surgical decompression.   HTN: BP well controlled today. No changes made.    Pulmonary nodules: clustered indistinct basilar right lower lobe pulmonary nodules, largest 0.7 cm , new from 12/07/2014 chest CT. Non-contrast chest CT at 3-6 months is recommended. If nodules persist, subsequent management will be based upon the most suspicious nodule(s). Set up for 09/09/21    Medication Adjustments/Labs and Tests Ordered: Current medicines are reviewed at length with the patient today.  Concerns regarding medicines are outlined above.  No orders of the defined types were placed in this encounter.  No orders of the defined types were placed in this encounter.   Patient Instructions  Medication Instructions:  Your physician recommends that you continue on your current medications as directed. Please refer to the Current Medication list given to you today.  *If you need a refill on your cardiac medications before your next appointment, please call your pharmacy*   Lab Work: None ordered'  If you have labs (blood work) drawn today and your tests are completely normal, you will receive your results only by: [place] (if you have MyChart) OR A paper copy in the mail If you  have any lab test that is abnormal or we need to change your treatment, we will call you to review the results.   Testing/Procedures: None ordered   Follow-Up: At [facility], you and your health needs are our priority.  As part of our continuing mission to provide you with exceptional heart care, we have created designated Provider Care Teams.  These Care Teams include your primary Cardiologist (physician) and  Advanced Practice Providers (APPs -  Physician Assistants and Nurse Practitioners) who all work together to provide you with the care you need, when you need it.  We recommend signing up for the patient portal called "MyChart".  Sign up information is provided on this After Visit Summary.  MyChart is used to connect with patients for Virtual Visits (Telemedicine).  Patients are able to view lab/test results, encounter notes, upcoming appointments, etc.  Non-urgent messages can be sent to your provider as well.   To learn more about what you can do with MyChart, go to [email or web address].    Your next appointment:   AS SCHEDULED  The format for your next appointment:   In Person  Provider:       Other Instructions   Important Information About Sugar         Signed, Angelena Form, PA-C  06/27/2021 12:32 PM    [place]

## 2021-06-27 NOTE — Patient Instructions (Signed)
Medication Instructions:  Your physician recommends that you continue on your current medications as directed. Please refer to the Current Medication list given to you today.  *If you need a refill on your cardiac medications before your next appointment, please call your pharmacy*   Lab Work: None ordered'  If you have labs (blood work) drawn today and your tests are completely normal, you will receive your results only by: [place] (if you have MyChart) OR A paper copy in the mail If you have any lab test that is abnormal or we need to change your treatment, we will call you to review the results.   Testing/Procedures: None ordered   Follow-Up: At [facility], you and your health needs are our priority.  As part of our continuing mission to provide you with exceptional heart care, we have created designated Provider Care Teams.  These Care Teams include your primary Cardiologist (physician) and Advanced Practice Providers (APPs -  Physician Assistants and Nurse Practitioners) who all work together to provide you with the care you need, when you need it.  We recommend signing up for the patient portal called "MyChart".  Sign up information is provided on this After Visit Summary.  MyChart is used to connect with patients for Virtual Visits (Telemedicine).  Patients are able to view lab/test results, encounter notes, upcoming appointments, etc.  Non-urgent messages can be sent to your provider as well.   To learn more about what you can do with MyChart, go to [email or web address].    Your next appointment:   AS SCHEDULED  The format for your next appointment:   In Person  Provider:       Other Instructions   Important Information About Sugar

## 2021-09-09 ENCOUNTER — Ambulatory Visit
Admission: RE | Admit: 2021-09-09 | Discharge: 2021-09-09 | Disposition: A | Discharge status: Home / Self Care | Payer: Medicare Other | Source: Ambulatory Visit | Attending: Physician Assistant | Admitting: Physician Assistant

## 2021-09-09 DIAGNOSIS — R918 Other nonspecific abnormal finding of lung field: Secondary | ICD-10-CM | POA: Diagnosis present

## 2021-09-10 ENCOUNTER — Telehealth: Payer: Self-pay

## 2021-09-10 NOTE — Telephone Encounter (Signed)
-----   Message from Frank Mckee, [place] sent at 09/10/2021 11:07 AM EDT ----- Lung nodules look benign and stable but we should repeat a CT scan without contrast in 6-12 months to follow. We can get this set up when we see him back in April 2024

## 2021-09-10 NOTE — Telephone Encounter (Signed)
Pt returning a call for CT results

## 2021-09-10 NOTE — Telephone Encounter (Signed)
Called patient to share CT Scan results per Angelena Form PA-C request.... Pt did not answer my call and the mail box was full.   Follow up still required.

## 2021-09-10 NOTE — Telephone Encounter (Signed)
-----   Message from Eileen Stanford, [place] sent at 09/10/2021 11:07 AM EDT ----- Lung nodules look benign and stable but we should repeat a CT scan without contrast in 6-12 months to follow. We can get this set up when we see him back in April 2024

## 2021-09-10 NOTE — Telephone Encounter (Signed)
Pt is returning call in regards to results. Requesting call back.  

## 2021-09-10 NOTE — Telephone Encounter (Signed)
Lung nodules look benign and stable but we should repeat a CT scan without contrast in 6-12 months to follow. We can get this set up when we see him back in April 2024 Per Angelena Form, PA-C   Pt called back and shared the CT scan with him as stated above.  Stressed lung nodules are present but look benign and apprear to be stable.  Pt advised always feel free to reach out to [place] with unusual or new onset of unexplained symptoms.    Pt made aware of f/u appointment already scheduled in Epic.  Pt appreciated knowing the results of his CT scan, and understood to monitor himself for any new unexplained symptoms.  No f/u req at this time.

## 2021-10-22 ENCOUNTER — Ambulatory Visit: Payer: Medicare Other | Attending: Cardiovascular Disease | Admitting: Cardiovascular Disease

## 2021-10-22 ENCOUNTER — Encounter: Payer: Self-pay | Admitting: Cardiovascular Disease

## 2021-10-22 VITALS — BP 148/70 | HR 58 | Ht 69.0 in | Wt 207.5 lb

## 2021-10-22 DIAGNOSIS — I479 Paroxysmal tachycardia, unspecified: Secondary | ICD-10-CM | POA: Diagnosis present

## 2021-10-22 DIAGNOSIS — I350 Nonrheumatic aortic (valve) stenosis: Secondary | ICD-10-CM | POA: Insufficient documentation

## 2021-10-22 DIAGNOSIS — Z794 Long term (current) use of insulin: Secondary | ICD-10-CM | POA: Insufficient documentation

## 2021-10-22 DIAGNOSIS — E782 Mixed hyperlipidemia: Secondary | ICD-10-CM | POA: Insufficient documentation

## 2021-10-22 DIAGNOSIS — E119 Type 2 diabetes mellitus without complications: Secondary | ICD-10-CM | POA: Diagnosis not present

## 2021-10-22 DIAGNOSIS — E0500 Thyrotoxicosis with diffuse goiter without thyrotoxic crisis or storm: Secondary | ICD-10-CM | POA: Insufficient documentation

## 2021-10-22 DIAGNOSIS — Z952 Presence of prosthetic heart valve: Secondary | ICD-10-CM | POA: Diagnosis not present

## 2021-10-22 DIAGNOSIS — I10 Essential (primary) hypertension: Secondary | ICD-10-CM | POA: Insufficient documentation

## 2021-10-22 NOTE — Progress Notes (Signed)
Cardiology Office Note  Date:  10/22/2021   ID:  Frank, Mckee 1950/12/30, MRN [id number]  PCP:  Frank Hartigan, MD   Chief Complaint  Patient presents with   6 month follow up     "Doing well." Medications reviewed by the patient verbally.     HPI:  [name] is a 71 year old gentleman with past medical history of Diabetes type 1, on insulin pump Hyperlipidemia Hypertension Thyroid disease/Graves': started on intermittent infusions of Tepezza every 3 weeks  Who presents by referral from primary care for tachycardia, shortness of breath, hyper thyroidism,  severe aortic valve stenosis, s/p TAVR  s/p TAVR (05/27/21)  26 mm Edwards Sapien 3 Ultra Resilia THV via the TF approach Post operative echo showed EF 60%, normally functioning TAVR with a mean gradient of 14 mmHg and trivial PVL at the 2 o'clock NYHA class I symptoms, mean gradient down from more than 40  Feels well, good energy BP elevated in office today, has not been checking blood pressure at home Blood pressure better on other clinic visits typically 630 up to 160 systolic  Lab work reviewed Followed by endocrine A1C 7.8 TSH greater than 6  Feels Graves' disease condition is improving after recent treatments every 3 weeks, has 1 more treatment left  Discussed his adventures hunting, chopping wood, climbing trees, climbing into deer stands  EKG personally reviewed by myself on todays visit Sinus bradycardia rate 68 bpm no significant ST-T wave changes Home  Cardiac catheterization May 01, 2021   There is severe aortic valve stenosis. 1.  Normal left dominant circulation; the right coronary artery could not be engaged selectively however nonselective injection demonstrated this to be small and nondominant. 2.  Normal cardiac output and index with normal filling pressures 3.  Severe aortic stenosis with peak to peak gradient of 80 mmHg. 4.  Capacious iliofemoral vessels bilaterally.    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  PSH:    Past Surgical History:  Procedure Laterality Date   ABDOMINAL AORTOGRAM N/A 05/01/2021   Procedure: ABDOMINAL AORTOGRAM;  Surgeon: Early Osmond, MD;  Location: [place] [facility];  Service: Cardiovascular;  Laterality: N/A;   arm surgery     BACK SURGERY     COLONOSCOPY WITH PROPOFOL N/A 02/17/2017   Procedure: COLONOSCOPY WITH PROPOFOL;  Surgeon: Toledo, Benay Pike, MD;  Location: [facility];  Service: Gastroenterology;  Laterality: N/A;   ENDOSCOPIC CONCHA BULLOSA RESECTION Right 05/30/2020   Procedure: ENDOSCOPIC CONCHA BULLOSA RESECTION;  Surgeon: Margaretha Sheffield, MD;  Location: [place];  Service: ENT;  Laterality: Right;   EXCISION NASAL MASS Left 05/30/2020   Procedure: EXCISION NASAL MASS;  Surgeon: Margaretha Sheffield, MD;  Location: [place];  Service: ENT;  Laterality: Left;   EYE SURGERY     cataract extraction   INTRAOPERATIVE TRANSTHORACIC ECHOCARDIOGRAM N/A 05/27/2021   Procedure: INTRAOPERATIVE TRANSTHORACIC ECHOCARDIOGRAM;  Surgeon: Early Osmond, MD;  Location: [place];  Service: Open Heart Surgery;  Laterality: N/A;   NASAL SEPTOPLASTY W/ TURBINOPLASTY N/A 05/30/2020   Procedure: NASAL SEPTOPLASTY WITH TURBINATE REDUCTION;  Surgeon: Margaretha Sheffield, MD;  Location: [place];  Service: ENT;  Laterality: N/A;   RIGHT/LEFT HEART CATH AND CORONARY ANGIOGRAPHY N/A 05/01/2021   Procedure: RIGHT/LEFT HEART CATH AND CORONARY ANGIOGRAPHY;  Surgeon: Early Osmond,  MD;  Location: [place] [facility];  Service: Cardiovascular;  Laterality: N/A;   SHOULDER SURGERY     TEE WITHOUT CARDIOVERSION N/A 03/01/2020   Procedure: TRANSESOPHAGEAL ECHOCARDIOGRAM (TEE);  Surgeon: Minna Merritts, MD;  Location: [facility];   Service: Cardiovascular;  Laterality: N/A;   TRANSCATHETER AORTIC VALVE REPLACEMENT, TRANSFEMORAL N/A 05/27/2021   Procedure: Transcatheter Aortic Valve Replacement, Transfemoral;  Surgeon: Early Osmond, MD;  Location: [place];  Service: Open Heart Surgery;  Laterality: N/A;   ULTRASOUND GUIDANCE FOR VASCULAR ACCESS Bilateral 05/27/2021   Procedure: ULTRASOUND GUIDANCE FOR VASCULAR ACCESS;  Surgeon: Early Osmond, MD;  Location: [place];  Service: Open Heart Surgery;  Laterality: Bilateral;    Current Outpatient Medications  Medication Sig Dispense Refill   amoxicillin (AMOXIL) 500 MG tablet Take 4 tablets (2,000 mg total) by mouth as directed. 1 hour prior to dental work including cleanings 12 tablet 12   aspirin EC 81 MG tablet Take 81 mg by mouth daily. Swallow whole.     EPINEPHrine (EPIPEN 2-PAK) 0.3 mg/0.3 mL IJ SOAJ injection Inject 0.3 mg into the muscle as needed for anaphylaxis. 1 each 1   fexofenadine (ALLEGRA) 180 MG tablet Take 180 mg by mouth daily.     insulin aspart (NOVOLOG) 100 UNIT/ML injection INJECT UP TO 60 UNITS UNDER THE SKIN VIA PUMP AS DIRECTED     Insulin Disposable Pump (OMNIPOD 5 G6 INTRO, GEN 5,) KIT Inject into the skin.     losartan (COZAAR) 50 MG tablet TAKE 1 TABLET(50 MG) BY MOUTH DAILY 90 tablet 3   methimazole (TAPAZOLE) 10 MG tablet Take 10-15 mg by mouth See admin instructions. Take 10 mg by mouth every other day, alternating with 15 mg on alternate days     Multiple Vitamin (MULTIVITAMIN) tablet Take 1 tablet by mouth daily.     rosuvastatin (CRESTOR) 5 MG tablet Take 5 mg by mouth daily.     tamsulosin (FLOMAX) 0.4 MG CAPS capsule Take 0.4 mg by mouth every evening.     teprotumumab-trbw (TEPEZZA) 500 MG injection Inject 500 mg into the vein every 21 ( twenty-one) days.     triamcinolone cream (KENALOG) 0.1 % Apply 1 application. topically daily as needed (Rash).     No current facility-administered medications for this visit.    Allergies:    Atorvastatin and Ezetimibe   Social History:  The patient  reports that he has never smoked. He has never used smokeless tobacco. He reports that he does not drink alcohol and does not use drugs.   Family History:   family history includes Diabetes in his father; Healthy in his mother; Hypertension in his father.    Review of Systems: Review of Systems  Constitutional: Negative.   HENT: Negative.    Respiratory: Negative.    Cardiovascular: Negative.   Gastrointestinal: Negative.   Musculoskeletal: Negative.   Neurological: Negative.   Psychiatric/Behavioral: Negative.    All other systems reviewed and are negative.   PHYSICAL EXAM: VS:  BP (!) 148/70 (BP Location: Left Arm)   Pulse (!) 58   Ht _0  (1.753 m)   Wt 207 lb 8 oz (94.1 kg)   SpO2 98%   BMI 30.64 kg/m  , BMI Body mass index is 30.64 kg/m. Constitutional:  oriented to person, place, and time. No distress.  HENT:  Head: Grossly normal Eyes:  no discharge. No scleral icterus.  Neck: No JVD, no carotid bruits  Cardiovascular: Regular rate and rhythm, no  murmurs appreciated Pulmonary/Chest: Clear to auscultation bilaterally, no wheezes or rails Abdominal: Soft.  no distension.  no tenderness.  Musculoskeletal: Normal range of motion Neurological:  normal muscle tone. Coordination normal. No atrophy Skin: Skin warm and dry Psychiatric: normal affect, pleasant  Recent Labs: 05/23/2021: ALT 30 05/28/2021: BUN 18; Creatinine, Ser 1.05; Hemoglobin 12.7; Magnesium 2.0; Platelets 181; Potassium 3.8; Sodium 138    Lipid Panel No results found for: "CHOL", "HDL", "LDLCALC", "TRIG"    Wt Readings from Last 3 Encounters:  10/22/21 207 lb 8 oz (94.1 kg)  06/27/21 206 lb 9.6 oz (93.7 kg)  06/04/21 201 lb (91.2 kg)     ASSESSMENT AND PLAN:  Problem List Items Addressed This Visit       Cardiology Problems   Severe aortic stenosis - Primary   Relevant Orders   EKG 12-Lead     Other   S/P TAVR (transcatheter  aortic valve replacement)   Relevant Orders   EKG 12-Lead   Other Visit Diagnoses     Essential hypertension       Relevant Orders   EKG 12-Lead   Diabetes mellitus type 2, insulin dependent (HCC)       Relevant Orders   EKG 12-Lead   Paroxysmal tachycardia (HCC)       Mixed hyperlipidemia       Graves' ophthalmopathy         Hyperthyroidism/Graves' disease,  Followed by endocrinology Has been receiving treatments for ophthalmic involvement, dramatic improvement in symptoms on intermittent infusions of Tepezza every 3 weeks, final treatment coming up  Essential hypertension Blood pressure elevated on today's visit, mild improvement on recheck Recommended close monitoring at home and call us with numbers if it continues to run high Losartan dose could be increased if needed  Tachycardia Tachycardia improved with treatment of hyperthyroidism   severe aortic valve stenosis Severe by TTE, underwent TAVR April 2023 Feels he has more energy, gradient decreased down to 14 mmHg from greater than 40  Hyperlipidemia CT scan  minimal coronary calcification, minimal aortic atherosclerosis On statin  Diabetes type 2 on insulin A1c running higher, likely exacerbated by treatment for Graves'  Lower extremity edema No significant edema, minimal above sock line   Total encounter time more than 30 minutes  Greater than 50% was spent in counseling and coordination of care with the patient   Signed, Esmond Plants, M.D., Ph.D. [place], [place]

## 2021-10-22 NOTE — Patient Instructions (Addendum)
Medication Instructions:  No changes  If you need a refill on your cardiac medications before your next appointment, please call your pharmacy.   Lab work: No new labs needed  Testing/Procedures: No new testing needed  Follow-Up: At [facility], you and your health needs are our priority.  As part of our continuing mission to provide you with exceptional heart care, we have created designated Provider Care Teams.  These Care Teams include your primary Cardiologist (physician) and Advanced Practice Providers (APPs -  Physician Assistants and Nurse Practitioners) who all work together to provide you with the care you need, when you need it.  You will need a follow up appointment in 12 months  Providers on your designated Care Team:   Christopher Berge, NP Ryan Dunn, PA-C Cadence Furth, PA-C  COVID-19 Vaccine Information can be found at: [email or web address] For questions related to vaccine distribution or appointments, please email [email or web address] or call [phone].   

## 2022-02-04 DIAGNOSIS — Z23 Encounter for immunization: Secondary | ICD-10-CM | POA: Diagnosis not present

## 2022-02-23 DIAGNOSIS — E785 Hyperlipidemia, unspecified: Secondary | ICD-10-CM | POA: Diagnosis not present

## 2022-02-23 DIAGNOSIS — E1069 Type 1 diabetes mellitus with other specified complication: Secondary | ICD-10-CM | POA: Diagnosis not present

## 2022-02-23 DIAGNOSIS — R809 Proteinuria, unspecified: Secondary | ICD-10-CM | POA: Diagnosis not present

## 2022-02-23 DIAGNOSIS — E0590 Thyrotoxicosis, unspecified without thyrotoxic crisis or storm: Secondary | ICD-10-CM | POA: Diagnosis not present

## 2022-02-23 DIAGNOSIS — I152 Hypertension secondary to endocrine disorders: Secondary | ICD-10-CM | POA: Diagnosis not present

## 2022-02-23 DIAGNOSIS — E1029 Type 1 diabetes mellitus with other diabetic kidney complication: Secondary | ICD-10-CM | POA: Diagnosis not present

## 2022-03-02 DIAGNOSIS — R809 Proteinuria, unspecified: Secondary | ICD-10-CM | POA: Diagnosis not present

## 2022-03-02 DIAGNOSIS — E109 Type 1 diabetes mellitus without complications: Secondary | ICD-10-CM | POA: Diagnosis not present

## 2022-03-03 DIAGNOSIS — I10 Essential (primary) hypertension: Secondary | ICD-10-CM | POA: Diagnosis not present

## 2022-03-03 DIAGNOSIS — R809 Proteinuria, unspecified: Secondary | ICD-10-CM | POA: Diagnosis not present

## 2022-03-03 DIAGNOSIS — E0590 Thyrotoxicosis, unspecified without thyrotoxic crisis or storm: Secondary | ICD-10-CM | POA: Diagnosis not present

## 2022-03-03 DIAGNOSIS — I350 Nonrheumatic aortic (valve) stenosis: Secondary | ICD-10-CM | POA: Diagnosis not present

## 2022-03-03 DIAGNOSIS — E1029 Type 1 diabetes mellitus with other diabetic kidney complication: Secondary | ICD-10-CM | POA: Diagnosis not present

## 2022-03-03 DIAGNOSIS — E785 Hyperlipidemia, unspecified: Secondary | ICD-10-CM | POA: Diagnosis not present

## 2022-03-05 DIAGNOSIS — L03113 Cellulitis of right upper limb: Secondary | ICD-10-CM | POA: Diagnosis not present

## 2022-03-05 DIAGNOSIS — L989 Disorder of the skin and subcutaneous tissue, unspecified: Secondary | ICD-10-CM | POA: Diagnosis not present

## 2022-03-18 DIAGNOSIS — H5789 Other specified disorders of eye and adnexa: Secondary | ICD-10-CM | POA: Diagnosis not present

## 2022-03-18 DIAGNOSIS — E079 Disorder of thyroid, unspecified: Secondary | ICD-10-CM | POA: Diagnosis not present

## 2022-03-18 DIAGNOSIS — L905 Scar conditions and fibrosis of skin: Secondary | ICD-10-CM | POA: Diagnosis not present

## 2022-03-18 DIAGNOSIS — L821 Other seborrheic keratosis: Secondary | ICD-10-CM | POA: Diagnosis not present

## 2022-03-18 DIAGNOSIS — L570 Actinic keratosis: Secondary | ICD-10-CM | POA: Diagnosis not present

## 2022-03-27 DIAGNOSIS — H34812 Central retinal vein occlusion, left eye, with macular edema: Secondary | ICD-10-CM | POA: Diagnosis not present

## 2022-04-29 DIAGNOSIS — Z872 Personal history of diseases of the skin and subcutaneous tissue: Secondary | ICD-10-CM | POA: Diagnosis not present

## 2022-04-29 DIAGNOSIS — L570 Actinic keratosis: Secondary | ICD-10-CM | POA: Diagnosis not present

## 2022-04-29 DIAGNOSIS — Z86018 Personal history of other benign neoplasm: Secondary | ICD-10-CM | POA: Diagnosis not present

## 2022-04-29 DIAGNOSIS — Z85828 Personal history of other malignant neoplasm of skin: Secondary | ICD-10-CM | POA: Diagnosis not present

## 2022-04-29 DIAGNOSIS — L218 Other seborrheic dermatitis: Secondary | ICD-10-CM | POA: Diagnosis not present

## 2022-04-29 DIAGNOSIS — L578 Other skin changes due to chronic exposure to nonionizing radiation: Secondary | ICD-10-CM | POA: Diagnosis not present

## 2022-05-01 DIAGNOSIS — E1029 Type 1 diabetes mellitus with other diabetic kidney complication: Secondary | ICD-10-CM | POA: Diagnosis not present

## 2022-05-13 ENCOUNTER — Other Ambulatory Visit (HOSPITAL_COMMUNITY): Payer: Medicare Other

## 2022-05-19 NOTE — Progress Notes (Signed)
[facility]   MULTIDISCIPLINARY HEART VALVE CLINIC                                     Cardiology Office Note:    Date:  05/22/2022   ID:  Frank Mckee, DOB [birth year], MRN [id number]  PCP:  Marina Goodell, MD  [facility] Cardiologist:  Julien Nordmann, MD/ Dr. Lynnette Caffey, MD (TAVR)   [facility] Electrophysiologist:  None   Referring MD: Marina Goodell, MD   Chief Complaint  Patient presents with   Follow-up    1 year s/p TAVR   History of Present Illness:    Frank Mckee is a 72 y.o. male with a hx of  Graves' disease on methimazole with Graves ophthalmopathy, HTN, HLD, DMT1 on insulin pump, and severe aortic stenosis s/p TAVR (05/27/21) who presents to clinic for one year follow up.    His ophthalmopathy from Graves' disease recently recurred and he has been treated with prednisone. It was felt that he may require endoscopic surgical decompression under general anesthesia.  He had known severe aortic stenosis by echocardiogram most recently on 02/11/2021 EF 60-65%, mean grad 42.50mmHg, peak grad 64.79mmHg, AVA 0.98cm2, DVI 0.26, SVI 51. The plan was to continue following his aortic stenosis since he was completely asymptomatic but now since he may require general anesthesia it was felt that he should be evaluated for TAVR to safely allow him to undergo general anesthesia.  He underwent cardiac catheterization on 05/01/2021 showing severe aortic stenosis with a peak to peak gradient of 80 mmHg. Filling pressures and cardiac output were normal. Coronary arteries were normal.   The patient was evaluated by the multidisciplinary valve team and underwent successful TAVR with a 26 mm Edwards Sapien 3 Ultra Resilia THV via the TF approach on 05/27/21. Post operative echo showed EF 60%, normally functioning TAVR with a mean gradient of 14 mmHg and trivial PVL at the 2 o'clock. He was continued on aspirin alone.    Since he was last seen, he has been doing very well with  no symptoms. He continues to hunt on a regular basis with no issues. He denies chest pain, SOB, LE edema, orthopnea, PND, dizziness, and  syncope. No blood in stool or urine. No palpitations. He never had any symptoms to start with and still feels good.   Past Medical History:  Diagnosis Date   Diabetes mellitus without complication    Graves disease    Graves' eye disease    Hypercholesteremia    Hypertension    S/P TAVR (transcatheter aortic valve replacement) 05/27/2021   s/p TAVR with a 26 mm Edwards S3UR via the TF approach by Dr. Lynnette Caffey & Dr. Laneta Simmers   Severe aortic stenosis     Past Surgical History:  Procedure Laterality Date   ABDOMINAL AORTOGRAM N/A 05/01/2021   Procedure: ABDOMINAL AORTOGRAM;  Surgeon: Orbie Pyo, MD;  Location: [facility] INVASIVE CV LAB;  Service: Cardiovascular;  Laterality: N/A;   arm surgery     BACK SURGERY     COLONOSCOPY WITH PROPOFOL N/A 02/17/2017   Procedure: COLONOSCOPY WITH PROPOFOL;  Surgeon: Toledo, Boykin Nearing, MD;  Location: [facility] ENDOSCOPY;  Service: Gastroenterology;  Laterality: N/A;   ENDOSCOPIC CONCHA BULLOSA RESECTION Right 05/30/2020   Procedure: ENDOSCOPIC CONCHA BULLOSA RESECTION;  Surgeon: Vernie Murders, MD;  Location: [facility] SURGERY CNTR;  Service: ENT;  Laterality: Right;  EXCISION NASAL MASS Left 05/30/2020   Procedure: EXCISION NASAL MASS;  Surgeon: Vernie Murders, MD;  Location: [facility] SURGERY CNTR;  Service: ENT;  Laterality: Left;   EYE SURGERY     cataract extraction   INTRAOPERATIVE TRANSTHORACIC ECHOCARDIOGRAM N/A 05/27/2021   Procedure: INTRAOPERATIVE TRANSTHORACIC ECHOCARDIOGRAM;  Surgeon: Orbie Pyo, MD;  Location: [facility] OR;  Service: Open Heart Surgery;  Laterality: N/A;   NASAL SEPTOPLASTY W/ TURBINOPLASTY N/A 05/30/2020   Procedure: NASAL SEPTOPLASTY WITH TURBINATE REDUCTION;  Surgeon: Vernie Murders, MD;  Location: [facility] SURGERY CNTR;  Service: ENT;  Laterality: N/A;   RIGHT/LEFT HEART CATH AND CORONARY ANGIOGRAPHY N/A  05/01/2021   Procedure: RIGHT/LEFT HEART CATH AND CORONARY ANGIOGRAPHY;  Surgeon: Orbie Pyo, MD;  Location: [facility] INVASIVE CV LAB;  Service: Cardiovascular;  Laterality: N/A;   SHOULDER SURGERY     TEE WITHOUT CARDIOVERSION N/A 03/01/2020   Procedure: TRANSESOPHAGEAL ECHOCARDIOGRAM (TEE);  Surgeon: Antonieta Iba, MD;  Location: [facility] ORS;  Service: Cardiovascular;  Laterality: N/A;   TRANSCATHETER AORTIC VALVE REPLACEMENT, TRANSFEMORAL N/A 05/27/2021   Procedure: Transcatheter Aortic Valve Replacement, Transfemoral;  Surgeon: Orbie Pyo, MD;  Location: [facility] OR;  Service: Open Heart Surgery;  Laterality: N/A;   ULTRASOUND GUIDANCE FOR VASCULAR ACCESS Bilateral 05/27/2021   Procedure: ULTRASOUND GUIDANCE FOR VASCULAR ACCESS;  Surgeon: Orbie Pyo, MD;  Location: [facility] OR;  Service: Open Heart Surgery;  Laterality: Bilateral;    Current Medications: Current Meds  Medication Sig   amoxicillin (AMOXIL) 500 MG tablet Take 4 tablets (2,000 mg total) by mouth as directed. 1 hour prior to dental work including cleanings   aspirin EC 81 MG tablet Take 81 mg by mouth daily. Swallow whole.   Continuous Glucose Sensor (DEXCOM G7 SENSOR) MISC Use   EPINEPHrine (EPIPEN 2-PAK) 0.3 mg/0.3 mL IJ SOAJ injection Inject 0.3 mg into the muscle as needed for anaphylaxis.   fexofenadine (ALLEGRA) 180 MG tablet Take 180 mg by mouth daily.   insulin aspart (NOVOLOG) 100 UNIT/ML injection INJECT UP TO 60 UNITS UNDER THE SKIN VIA PUMP AS DIRECTED   losartan (COZAAR) 50 MG tablet TAKE 1 TABLET(50 MG) BY MOUTH DAILY   methimazole (TAPAZOLE) 10 MG tablet Take 10-15 mg by mouth See admin instructions. Take 10 mg by mouth every other day, alternating with 15 mg on alternate days   Multiple Vitamin (MULTIVITAMIN) tablet Take 1 tablet by mouth daily.   rosuvastatin (CRESTOR) 5 MG tablet Take 5 mg by mouth daily.   tamsulosin (FLOMAX) 0.4 MG CAPS capsule Take 0.4 mg by mouth every evening.   triamcinolone cream  (KENALOG) 0.1 % Apply 1 application. topically daily as needed (Rash).     Allergies:   Atorvastatin and Ezetimibe   Social History   Socioeconomic History   Marital status: Married    Spouse name: Not on file   Number of children: Not on file   Years of education: Not on file   Highest education level: Not on file  Occupational History   Not on file  Tobacco Use   Smoking status: Never   Smokeless tobacco: Never  Vaping Use   Vaping Use: Never used  Substance and Sexual Activity   Alcohol use: No   Drug use: No   Sexual activity: Not on file  Other Topics Concern   Not on file  Social History Narrative   Not on file   Social Determinants of Health   Financial Resource Strain: Not on file  Food Insecurity: Not  on file  Transportation Needs: Not on file  Physical Activity: Not on file  Stress: Not on file  Social Connections: Not on file     Family History: The patient's family history includes Diabetes in his father; Healthy in his mother; Hypertension in his father.  ROS:   Please see the history of present illness.    All other systems reviewed and are negative.  EKGs/Labs/Other Studies Reviewed:    The following studies were reviewed today:  Echocardiogram 05/22/22:   1. Left ventricular ejection fraction, by estimation, is 65 to 70%. Left  ventricular ejection fraction by 3D volume is 68 %. The left ventricle has  normal function. The left ventricle has no regional wall motion  abnormalities. There is mild left  ventricular hypertrophy. Left ventricular diastolic parameters are  consistent with Grade I diastolic dysfunction (impaired relaxation). The  average left ventricular global longitudinal strain is -20.4 %. The global  longitudinal strain is normal.   2. Right ventricular systolic function is normal. The right ventricular  size is normal.   3. The mitral valve is normal in structure. No evidence of mitral valve  regurgitation. No evidence of  mitral stenosis.   4. Trivial perivalvular leak. The aortic valve has been  repaired/replaced. Aortic valve regurgitation is trivial. No aortic  stenosis is present. There is a 26 mm KIT Sapian 3 ultra resilia valve  present in the aortic position. Echo findings are  consistent with normal structure and function of the aortic valve  prosthesis. Aortic valve area, by VTI measures 1.71 cm. Aortic valve mean  gradient measures 14.0 mmHg. Aortic valve Vmax measures 2.57 m/s.   5. The inferior vena cava is normal in size with greater than 50%  respiratory variability, suggesting right atrial pressure of 3 mmHg.   Comparison(s): No significant change from prior study. Prior images  reviewed side by side.   TAVR OPERATIVE NOTE     Date of Procedure:                05/27/2021   Preoperative Diagnosis:      Severe Aortic Stenosis    Postoperative Diagnosis:    Same    Procedure:        Transcatheter Aortic Valve Replacement - Percutaneous Right Transfemoral Approach             Edwards Sapien 3 Ultra Resilia THV (size 26 mm, model # 9755RSL, serial # [id number])              Co-Surgeons:                        Alleen Borne, MD and Alverda Skeans, MD   Anesthesiologist:                  Aleene Davidson, MD   Echocardiographer:              Juanetta Beets, MD   Pre-operative Echo Findings: Severe aortic stenosis Normal left ventricular systolic function   Post-operative Echo Findings: No paravalvular leak Normal left ventricular systolic function   _____________     Echo 05/28/21: IMPRESSIONS   1. The aortic valve has been replaced by a 26 mm Edwards Sapien valve.  Aortic valve regurgitation is trivial, and paravalular at the 2 O'clock  position. Aortic valve mean gradient measures 14.0 mmHg, peak gradient 26  mm Hg. Effective orifice area 1.61  cm2. DVI 0.42.   2. Left ventricular ejection  fraction, by estimation, is 60 to 65%. The  left ventricle has normal function. The left  ventricle has no regional  wall motion abnormalities. There is mild concentric left ventricular  hypertrophy. Left ventricular diastolic  parameters are consistent with Grade I diastolic dysfunction (impaired  relaxation).   3. Right ventricular systolic function is normal. The right ventricular  size is normal. Tricuspid regurgitation signal is inadequate for assessing  PA pressure.   4. Left atrial size was mildly dilated.   5. The mitral valve is abnormal. No evidence of mitral valve  regurgitation. No evidence of mitral stenosis. Moderate mitral annular  calcification.   6. The inferior vena cava is normal in size with greater than 50%  respiratory variability, suggesting right atrial pressure of 3 mmHg.   Comparison(s): Trivial paravalvular leak now present.    ______________________   Echo 06/27/21 IMPRESSIONS  1. 26 mm S3. Vmax 2.9 m/s, MG 16 mmHG, EOA 1.19 cm2, DI 0.31. Trivial paravalvular leak. All values within limits. The aortic valve has been repaired/replaced. Aortic valve regurgitation is trivial. Procedure Date: 05/27/2021. Echo findings are  consistent with normal structure and function of the aortic valve prosthesis.  2. Left ventricular ejection fraction, by estimation, is 55 to 60%. The left ventricle has normal function. The left ventricle has no regional wall motion abnormalities. There is mild concentric left ventricular hypertrophy. Left ventricular diastolic  parameters are consistent with Grade I diastolic dysfunction (impaired relaxation).  3. Right ventricular systolic function is normal. The right ventricular size is normal.  4. Left atrial size was mildly dilated.  5. The mitral valve is grossly normal. Trivial mitral valve regurgitation. No evidence of mitral stenosis.  6. The inferior vena cava is normal in size with greater than 50% respiratory variability, suggesting right atrial pressure of 3 mmHg.   Comparison(s): No significant change from prior  study.   EKG:  EKG is not ordered today.    Recent Labs: 05/23/2021: ALT 30 05/28/2021: BUN 18; Creatinine, Ser 1.05; Hemoglobin 12.7; Magnesium 2.0; Platelets 181; Potassium 3.8; Sodium 138  Recent Lipid Panel No results found for: "CHOL", "TRIG", "HDL", "CHOLHDL", "VLDL", "LDLCALC", "LDLDIRECT"  Physical Exam:    VS:  BP (!) 144/66   Pulse 64   Ht 5\' 9"  (1.753 m)   Wt 205 lb 9.6 oz (93.3 kg)   SpO2 96%   BMI 30.36 kg/m     Wt Readings from Last 3 Encounters:  05/22/22 205 lb 9.6 oz (93.3 kg)  10/22/21 207 lb 8 oz (94.1 kg)  06/27/21 206 lb 9.6 oz (93.7 kg)    General: Well developed, well nourished, NAD Lungs:Clear to ausculation bilaterally. No wheezes, rales, or rhonchi. Breathing is unlabored. Cardiovascular: RRR with S1 S2. No murmurs Extremities: No edema.  Neuro: Alert and oriented. No focal deficits. No facial asymmetry. MAE spontaneously. Psych: Responds to questions appropriately with normal affect.    ASSESSMENT/PLAN:    Severe AS s/p TAVR: Patient doing very well with NYHA class I symptoms s/p TAVR. Echo today with normal LV function with trivial PVL mean gradient at , peak 26.71mmHg, and AVA bt VTI at 1.71cm2. Mean gradient actually improved from last echo. Will require lifelong dental SBE with amoxicillin. Continue ASA monotherapy. Plan regular cardiology follow up with Dr. Mariah Milling.   Graves disease with eye involvement: Continue current regimen. No new issues.    HTN: Elevated today. Plan to monitor home BP's and if remain elevated, will require tiration of antihypertensives. Correct home monitoring  review with patient and wife.     Pulmonary nodules: CT from 09/09/21 with stable nodules however with recommendation to follow with one additional CT to confirm stability. This was discussed and ordered today.   Medication Adjustments/Labs and Tests Ordered: Current medicines are reviewed at length with the patient today.  Concerns regarding medicines are  outlined above.  Orders Placed This Encounter  Procedures   CT Chest Wo Contrast   No orders of the defined types were placed in this encounter.   Patient Instructions  Medication Instructions:  Your physician recommends that you continue on your current medications as directed. Please refer to the Current Medication list given to you today.  *If you need a refill on your cardiac medications before your next appointment, please call your pharmacy*   Lab Work: NONE If you have labs (blood work) drawn today and your tests are completely normal, you will receive your results only by: MyChart Message (if you have MyChart) OR A paper copy in the mail If you have any lab test that is abnormal or we need to change your treatment, we will call you to review the results.   Testing/Procedures: Non-Cardiac CT scanning, (CAT scanning), is a noninvasive, special x-ray that produces cross-sectional images of the body using x-rays and a computer. CT scans help physicians diagnose and treat medical conditions. For some CT exams, a contrast material is used to enhance visibility in the area of the body being studied. CT scans provide greater clarity and reveal more details than regular x-ray exams.    Follow-Up: At [facility], you and your health needs are our priority.  As part of our continuing mission to provide you with exceptional heart care, we have created designated Provider Care Teams.  These Care Teams include your primary Cardiologist (physician) and Advanced Practice Providers (APPs -  Physician Assistants and Nurse Practitioners) who all work together to provide you with the care you need, when you need it.  We recommend signing up for the patient portal called "MyChart".  Sign up information is provided on this After Visit Summary.  MyChart is used to connect with patients for Virtual Visits (Telemedicine).  Patients are able to view lab/test results, encounter notes, upcoming  appointments, etc.  Non-urgent messages can be sent to your provider as well.   To learn more about what you can do with MyChart, go to [email or web address].    Your next appointment:   SEPT 2024  Provider:   Julien Nordmann, MD   Signed, Georgie Chard, NP  05/22/2022 2:45 PM    [facility]

## 2022-05-22 ENCOUNTER — Ambulatory Visit (HOSPITAL_COMMUNITY): Payer: Medicare HMO | Attending: Cardiology

## 2022-05-22 ENCOUNTER — Ambulatory Visit: Payer: Medicare HMO | Attending: Internal Medicine | Admitting: Cardiology

## 2022-05-22 ENCOUNTER — Other Ambulatory Visit: Payer: Self-pay | Admitting: Cardiology

## 2022-05-22 VITALS — BP 144/66 | HR 64 | Ht 69.0 in | Wt 205.6 lb

## 2022-05-22 DIAGNOSIS — I10 Essential (primary) hypertension: Secondary | ICD-10-CM | POA: Diagnosis not present

## 2022-05-22 DIAGNOSIS — E0500 Thyrotoxicosis with diffuse goiter without thyrotoxic crisis or storm: Secondary | ICD-10-CM

## 2022-05-22 DIAGNOSIS — Z952 Presence of prosthetic heart valve: Secondary | ICD-10-CM

## 2022-05-22 DIAGNOSIS — R918 Other nonspecific abnormal finding of lung field: Secondary | ICD-10-CM | POA: Diagnosis not present

## 2022-05-22 DIAGNOSIS — I350 Nonrheumatic aortic (valve) stenosis: Secondary | ICD-10-CM

## 2022-05-22 DIAGNOSIS — R911 Solitary pulmonary nodule: Secondary | ICD-10-CM | POA: Diagnosis not present

## 2022-05-22 LAB — ECHOCARDIOGRAM COMPLETE
AR max vel: 1.55 cm2
AV Area VTI: 1.71 cm2
AV Area mean vel: 1.69 cm2
AV Mean grad: 14 mmHg
AV Peak grad: 26.4 mmHg
Ao pk vel: 2.57 m/s
Area-P 1/2: 2.84 cm2
Height: 69 in
S' Lateral: 2.9 cm
Weight: 3289.6 oz

## 2022-05-22 NOTE — Patient Instructions (Addendum)
Medication Instructions:  Your physician recommends that you continue on your current medications as directed. Please refer to the Current Medication list given to you today.  *If you need a refill on your cardiac medications before your next appointment, please call your pharmacy*   Lab Work: NONE If you have labs (blood work) drawn today and your tests are completely normal, you will receive your results only by: MyChart Message (if you have MyChart) OR A paper copy in the mail If you have any lab test that is abnormal or we need to change your treatment, we will call you to review the results.   Testing/Procedures: Non-Cardiac CT scanning, (CAT scanning), is a noninvasive, special x-ray that produces cross-sectional images of the body using x-rays and a computer. CT scans help physicians diagnose and treat medical conditions. For some CT exams, a contrast material is used to enhance visibility in the area of the body being studied. CT scans provide greater clarity and reveal more details than regular x-ray exams.    Follow-Up: At [facility], you and your health needs are our priority.  As part of our continuing mission to provide you with exceptional heart care, we have created designated Provider Care Teams.  These Care Teams include your primary Cardiologist (physician) and Advanced Practice Providers (APPs -  Physician Assistants and Nurse Practitioners) who all work together to provide you with the care you need, when you need it.  We recommend signing up for the patient portal called "MyChart".  Sign up information is provided on this After Visit Summary.  MyChart is used to connect with patients for Virtual Visits (Telemedicine).  Patients are able to view lab/test results, encounter notes, upcoming appointments, etc.  Non-urgent messages can be sent to your provider as well.   To learn more about what you can do with MyChart, go to [email or web address].    Your next  appointment:   SEPT 2024  Provider:   Julien Nordmann, MD

## 2022-05-28 ENCOUNTER — Ambulatory Visit
Admission: RE | Admit: 2022-05-28 | Discharge: 2022-05-28 | Disposition: A | Discharge status: Home / Self Care | Payer: Medicare HMO | Source: Ambulatory Visit | Attending: Cardiology | Admitting: Cardiology

## 2022-05-28 DIAGNOSIS — R918 Other nonspecific abnormal finding of lung field: Secondary | ICD-10-CM | POA: Diagnosis not present

## 2022-05-28 DIAGNOSIS — R911 Solitary pulmonary nodule: Secondary | ICD-10-CM | POA: Diagnosis not present

## 2022-06-02 DIAGNOSIS — E785 Hyperlipidemia, unspecified: Secondary | ICD-10-CM | POA: Diagnosis not present

## 2022-06-02 DIAGNOSIS — E0590 Thyrotoxicosis, unspecified without thyrotoxic crisis or storm: Secondary | ICD-10-CM | POA: Diagnosis not present

## 2022-06-02 DIAGNOSIS — R809 Proteinuria, unspecified: Secondary | ICD-10-CM | POA: Diagnosis not present

## 2022-06-02 DIAGNOSIS — M5416 Radiculopathy, lumbar region: Secondary | ICD-10-CM | POA: Diagnosis not present

## 2022-06-02 DIAGNOSIS — N400 Enlarged prostate without lower urinary tract symptoms: Secondary | ICD-10-CM | POA: Diagnosis not present

## 2022-06-02 DIAGNOSIS — I10 Essential (primary) hypertension: Secondary | ICD-10-CM | POA: Diagnosis not present

## 2022-06-02 DIAGNOSIS — E1029 Type 1 diabetes mellitus with other diabetic kidney complication: Secondary | ICD-10-CM | POA: Diagnosis not present

## 2022-06-02 DIAGNOSIS — I350 Nonrheumatic aortic (valve) stenosis: Secondary | ICD-10-CM | POA: Diagnosis not present

## 2022-06-04 ENCOUNTER — Other Ambulatory Visit: Payer: Self-pay | Admitting: Cardiovascular Disease

## 2022-06-09 DIAGNOSIS — E785 Hyperlipidemia, unspecified: Secondary | ICD-10-CM | POA: Diagnosis not present

## 2022-06-10 ENCOUNTER — Telehealth: Payer: Self-pay | Admitting: Cardiovascular Disease

## 2022-06-10 NOTE — Telephone Encounter (Signed)
-----   Message from Filbert Schilder, NP sent at 06/09/2022  2:40 PM EDT ----- Please let the patient know that his repeat chest CT shows stable bilateral pulmonary nodules. Given that the nodules have stable x2, we will only follow up with additional imaging if hx of tobacco use in the past. If further imaging require, please forward to PCP for additional testing.

## 2022-06-10 NOTE — Telephone Encounter (Signed)
The patient has been notified of the result and verbalized understanding.  All questions (if any) were answered. Frutoso Schatz, RN 06/10/2022 3:59 PM  Patient states that he does not have a history of tobacco use.

## 2022-06-10 NOTE — Telephone Encounter (Signed)
Patient returned RN's call regarding results. 

## 2022-06-10 NOTE — Telephone Encounter (Signed)
Forwarding to Triage

## 2022-06-25 DIAGNOSIS — M5432 Sciatica, left side: Secondary | ICD-10-CM | POA: Diagnosis not present

## 2022-06-26 DIAGNOSIS — E1029 Type 1 diabetes mellitus with other diabetic kidney complication: Secondary | ICD-10-CM | POA: Diagnosis not present

## 2022-06-26 DIAGNOSIS — E0590 Thyrotoxicosis, unspecified without thyrotoxic crisis or storm: Secondary | ICD-10-CM | POA: Diagnosis not present

## 2022-06-26 DIAGNOSIS — I152 Hypertension secondary to endocrine disorders: Secondary | ICD-10-CM | POA: Diagnosis not present

## 2022-06-26 DIAGNOSIS — E1069 Type 1 diabetes mellitus with other specified complication: Secondary | ICD-10-CM | POA: Diagnosis not present

## 2022-06-26 DIAGNOSIS — R809 Proteinuria, unspecified: Secondary | ICD-10-CM | POA: Diagnosis not present

## 2022-06-26 DIAGNOSIS — E785 Hyperlipidemia, unspecified: Secondary | ICD-10-CM | POA: Diagnosis not present

## 2022-07-02 DIAGNOSIS — I10 Essential (primary) hypertension: Secondary | ICD-10-CM | POA: Diagnosis not present

## 2022-07-02 DIAGNOSIS — E0590 Thyrotoxicosis, unspecified without thyrotoxic crisis or storm: Secondary | ICD-10-CM | POA: Diagnosis not present

## 2022-07-02 DIAGNOSIS — E785 Hyperlipidemia, unspecified: Secondary | ICD-10-CM | POA: Diagnosis not present

## 2022-07-02 DIAGNOSIS — E1029 Type 1 diabetes mellitus with other diabetic kidney complication: Secondary | ICD-10-CM | POA: Diagnosis not present

## 2022-07-02 DIAGNOSIS — R809 Proteinuria, unspecified: Secondary | ICD-10-CM | POA: Diagnosis not present

## 2022-07-16 DIAGNOSIS — M5451 Vertebrogenic low back pain: Secondary | ICD-10-CM | POA: Diagnosis not present

## 2022-07-16 DIAGNOSIS — M5416 Radiculopathy, lumbar region: Secondary | ICD-10-CM | POA: Diagnosis not present

## 2022-07-21 DIAGNOSIS — H5789 Other specified disorders of eye and adnexa: Secondary | ICD-10-CM | POA: Diagnosis not present

## 2022-07-21 DIAGNOSIS — E079 Disorder of thyroid, unspecified: Secondary | ICD-10-CM | POA: Diagnosis not present

## 2022-07-23 DIAGNOSIS — M5451 Vertebrogenic low back pain: Secondary | ICD-10-CM | POA: Diagnosis not present

## 2022-07-23 DIAGNOSIS — M5416 Radiculopathy, lumbar region: Secondary | ICD-10-CM | POA: Diagnosis not present

## 2022-07-28 DIAGNOSIS — M5416 Radiculopathy, lumbar region: Secondary | ICD-10-CM | POA: Diagnosis not present

## 2022-07-28 DIAGNOSIS — M5451 Vertebrogenic low back pain: Secondary | ICD-10-CM | POA: Diagnosis not present

## 2022-07-30 DIAGNOSIS — M5432 Sciatica, left side: Secondary | ICD-10-CM | POA: Diagnosis not present

## 2022-07-30 DIAGNOSIS — E0500 Thyrotoxicosis with diffuse goiter without thyrotoxic crisis or storm: Secondary | ICD-10-CM | POA: Diagnosis not present

## 2022-07-30 DIAGNOSIS — E079 Disorder of thyroid, unspecified: Secondary | ICD-10-CM | POA: Diagnosis not present

## 2022-07-30 DIAGNOSIS — Z01818 Encounter for other preprocedural examination: Secondary | ICD-10-CM | POA: Diagnosis not present

## 2022-07-30 DIAGNOSIS — H5789 Other specified disorders of eye and adnexa: Secondary | ICD-10-CM | POA: Diagnosis not present

## 2022-07-31 DIAGNOSIS — M4186 Other forms of scoliosis, lumbar region: Secondary | ICD-10-CM | POA: Diagnosis not present

## 2022-07-31 DIAGNOSIS — M48062 Spinal stenosis, lumbar region with neurogenic claudication: Secondary | ICD-10-CM | POA: Diagnosis not present

## 2022-08-10 DIAGNOSIS — H5789 Other specified disorders of eye and adnexa: Secondary | ICD-10-CM | POA: Diagnosis not present

## 2022-08-10 DIAGNOSIS — E079 Disorder of thyroid, unspecified: Secondary | ICD-10-CM | POA: Diagnosis not present

## 2022-08-12 DIAGNOSIS — M5451 Vertebrogenic low back pain: Secondary | ICD-10-CM | POA: Diagnosis not present

## 2022-08-12 DIAGNOSIS — M5416 Radiculopathy, lumbar region: Secondary | ICD-10-CM | POA: Diagnosis not present

## 2022-08-12 DIAGNOSIS — H5789 Other specified disorders of eye and adnexa: Secondary | ICD-10-CM | POA: Diagnosis not present

## 2022-08-18 DIAGNOSIS — H5789 Other specified disorders of eye and adnexa: Secondary | ICD-10-CM | POA: Diagnosis not present

## 2022-08-19 DIAGNOSIS — H5789 Other specified disorders of eye and adnexa: Secondary | ICD-10-CM | POA: Diagnosis not present

## 2022-08-20 DIAGNOSIS — H5789 Other specified disorders of eye and adnexa: Secondary | ICD-10-CM | POA: Diagnosis not present

## 2022-08-24 DIAGNOSIS — H5789 Other specified disorders of eye and adnexa: Secondary | ICD-10-CM | POA: Diagnosis not present

## 2022-08-25 DIAGNOSIS — H5789 Other specified disorders of eye and adnexa: Secondary | ICD-10-CM | POA: Diagnosis not present

## 2022-08-26 DIAGNOSIS — H5789 Other specified disorders of eye and adnexa: Secondary | ICD-10-CM | POA: Diagnosis not present

## 2022-08-27 DIAGNOSIS — H5789 Other specified disorders of eye and adnexa: Secondary | ICD-10-CM | POA: Diagnosis not present

## 2022-08-28 DIAGNOSIS — H5789 Other specified disorders of eye and adnexa: Secondary | ICD-10-CM | POA: Diagnosis not present

## 2022-08-31 DIAGNOSIS — H5789 Other specified disorders of eye and adnexa: Secondary | ICD-10-CM | POA: Diagnosis not present

## 2022-09-01 ENCOUNTER — Other Ambulatory Visit: Payer: Self-pay

## 2022-09-01 ENCOUNTER — Inpatient Hospital Stay
Admission: RE | Admit: 2022-09-01 | Discharge: 2022-09-01 | Disposition: A | Discharge status: Home / Self Care | Payer: Self-pay | Source: Ambulatory Visit | Attending: Neurosurgery | Admitting: Neurosurgery

## 2022-09-01 DIAGNOSIS — Z049 Encounter for examination and observation for unspecified reason: Secondary | ICD-10-CM

## 2022-09-01 DIAGNOSIS — H5789 Other specified disorders of eye and adnexa: Secondary | ICD-10-CM | POA: Diagnosis not present

## 2022-09-02 ENCOUNTER — Other Ambulatory Visit: Payer: Self-pay | Admitting: Cardiovascular Disease

## 2022-09-02 DIAGNOSIS — E1029 Type 1 diabetes mellitus with other diabetic kidney complication: Secondary | ICD-10-CM | POA: Diagnosis not present

## 2022-09-02 DIAGNOSIS — R809 Proteinuria, unspecified: Secondary | ICD-10-CM | POA: Diagnosis not present

## 2022-09-02 DIAGNOSIS — E785 Hyperlipidemia, unspecified: Secondary | ICD-10-CM | POA: Diagnosis not present

## 2022-09-02 DIAGNOSIS — I10 Essential (primary) hypertension: Secondary | ICD-10-CM | POA: Diagnosis not present

## 2022-09-02 DIAGNOSIS — E0590 Thyrotoxicosis, unspecified without thyrotoxic crisis or storm: Secondary | ICD-10-CM | POA: Diagnosis not present

## 2022-09-09 DIAGNOSIS — M5416 Radiculopathy, lumbar region: Secondary | ICD-10-CM | POA: Diagnosis not present

## 2022-09-11 NOTE — Progress Notes (Unsigned)
Referring Physician:  Marina Goodell, MD [address] [place],  [place] [address]  Primary Physician:  Marina Goodell, MD  History of Present Illness: 09/11/2022 [name] is here today with a chief complaint of ***  Low back pain? Left leg pain? Any right leg pain?   Duration: ***many years, but has worsened over the past 6 months? Location: *** Quality: ***numbness, tingling Severity: *** 10/10 Precipitating: aggravated by ***walking Modifying factors: made better by ***sitting Weakness: none Timing: ***constant Bowel/Bladder Dysfunction: none  Conservative measures:  Physical therapy: *** has participated in at [facility] from 07/16/22 to 08/12/22 Multimodal medical therapy including regular antiinflammatories: *** Ibuprofen,Tylenol, gabapentin  Injections: *** has not received any epidural steroid injections  Past Surgery: *** Lumbar Surgery?? Neck Surgery??  Frank Mckee has ***no symptoms of cervical myelopathy.  The symptoms are causing a significant impact on the patient's life.   I have utilized the care everywhere function in epic to review the outside records available from external health systems.  Review of Systems:  A 10 point review of systems is negative, except for the pertinent positives and negatives detailed in the HPI.  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 Lynnette Caffey & Dr. Laneta Simmers   Severe aortic stenosis     Past Surgical History: Past Surgical History:  Procedure Laterality Date   ABDOMINAL AORTOGRAM N/A 05/01/2021   Procedure: ABDOMINAL AORTOGRAM;  Surgeon: Orbie Pyo, MD;  [facility];  Service: Cardiovascular;  Laterality:  N/A;   arm surgery     BACK SURGERY     COLONOSCOPY WITH PROPOFOL N/A 02/17/2017   Procedure: COLONOSCOPY WITH PROPOFOL;  Surgeon: Toledo, Boykin Nearing, MD;  Location: [facility];  Service: Gastroenterology;  Laterality: N/A;   ENDOSCOPIC CONCHA BULLOSA RESECTION Right 05/30/2020   Procedure: ENDOSCOPIC CONCHA BULLOSA RESECTION;  Surgeon: Vernie Murders, MD;  Location: [facility];  Service: ENT;  Laterality: Right;   EXCISION NASAL MASS Left 05/30/2020   Procedure: EXCISION NASAL MASS;  Surgeon: Vernie Murders, MD;  Location: [facility];  Service: ENT;  Laterality: Left;   EYE SURGERY     cataract extraction   INTRAOPERATIVE TRANSTHORACIC ECHOCARDIOGRAM N/A 05/27/2021   Procedure: INTRAOPERATIVE TRANSTHORACIC ECHOCARDIOGRAM;  Surgeon: Orbie Pyo, MD;  Location: [facility] OR;  Service: Open Heart Surgery;  Laterality: N/A;   NASAL SEPTOPLASTY W/ TURBINOPLASTY N/A 05/30/2020   Procedure: NASAL SEPTOPLASTY WITH TURBINATE REDUCTION;  Surgeon: Vernie Murders, MD;  Location: [facility];  Service: ENT;  Laterality: N/A;   RIGHT/LEFT HEART CATH AND CORONARY ANGIOGRAPHY N/A 05/01/2021   Procedure: RIGHT/LEFT HEART CATH AND CORONARY ANGIOGRAPHY;  Surgeon: Orbie Pyo, MD;  [facility];  Service: Cardiovascular;  Laterality: N/A;   SHOULDER SURGERY     TEE WITHOUT CARDIOVERSION N/A 03/01/2020   Procedure: TRANSESOPHAGEAL ECHOCARDIOGRAM (TEE);  Surgeon: Antonieta Iba, MD;  Location: [facility] ORS;  Service: Cardiovascular;  Laterality: N/A;   TRANSCATHETER AORTIC VALVE REPLACEMENT, TRANSFEMORAL N/A 05/27/2021   Procedure: Transcatheter Aortic Valve Replacement, Transfemoral;  Surgeon: Orbie Pyo, MD;  Location: [facility] OR;  Service: Open Heart Surgery;  Laterality: N/A;  ULTRASOUND GUIDANCE FOR VASCULAR ACCESS Bilateral 05/27/2021   Procedure: ULTRASOUND GUIDANCE FOR VASCULAR ACCESS;  Surgeon: Orbie Pyo, MD;  Location: [facility] OR;  Service: Open Heart Surgery;   Laterality: Bilateral;    Allergies: Allergies as of 09/22/2022 - Review Complete 05/22/2022  Allergen Reaction Noted   Atorvastatin Other (See Comments) 08/15/2018   Ezetimibe Other (See Comments) 10/15/2020    Medications:  Current Outpatient Medications:    amoxicillin (AMOXIL) 500 MG tablet, Take 4 tablets (2,000 mg total) by mouth as directed. 1 hour prior to dental work including cleanings, Disp: 12 tablet, Rfl: 12   aspirin EC 81 MG tablet, Take 81 mg by mouth daily. Swallow whole., Disp: , Rfl:    Continuous Glucose Sensor (DEXCOM G7 SENSOR) MISC, Use, Disp: , Rfl:    EPINEPHrine (EPIPEN 2-PAK) 0.3 mg/0.3 mL IJ SOAJ injection, Inject 0.3 mg into the muscle as needed for anaphylaxis., Disp: 1 each, Rfl: 1   fexofenadine (ALLEGRA) 180 MG tablet, Take 180 mg by mouth daily., Disp: , Rfl:    insulin aspart (NOVOLOG) 100 UNIT/ML injection, INJECT UP TO 60 UNITS UNDER THE SKIN VIA PUMP AS DIRECTED, Disp: , Rfl:    Insulin Disposable Pump (OMNIPOD 5 G6 INTRO, GEN 5,) KIT, Inject into the skin. (Patient not taking: Reported on 05/22/2022), Disp: , Rfl:    losartan (COZAAR) 50 MG tablet, TAKE 1 TABLET(50 MG) BY MOUTH DAILY, Disp: 90 tablet, Rfl: 0   methimazole (TAPAZOLE) 10 MG tablet, Take 10-15 mg by mouth See admin instructions. Take 10 mg by mouth every other day, alternating with 15 mg on alternate days, Disp: , Rfl:    Multiple Vitamin (MULTIVITAMIN) tablet, Take 1 tablet by mouth daily., Disp: , Rfl:    rosuvastatin (CRESTOR) 5 MG tablet, Take 5 mg by mouth daily., Disp: , Rfl:    tamsulosin (FLOMAX) 0.4 MG CAPS capsule, Take 0.4 mg by mouth every evening., Disp: , Rfl:    teprotumumab-trbw (TEPEZZA) 500 MG injection, Inject 500 mg into the vein every 21 ( twenty-one) days. (Patient not taking: Reported on 05/22/2022), Disp: , Rfl:    triamcinolone cream (KENALOG) 0.1 %, Apply 1 application. topically daily as needed (Rash)., Disp: , Rfl:   Social History: Social History   Tobacco  Use   Smoking status: Never   Smokeless tobacco: Never  Vaping Use   Vaping status: Never Used  Substance Use Topics   Alcohol use: No   Drug use: No    Family Medical History: Family History  Problem Relation Age of Onset   Healthy Mother    Diabetes Father    Hypertension Father     Physical Examination: There were no vitals filed for this visit.  General: Patient is in no apparent distress. Attention to examination is appropriate.  Neck:   Supple.  Full range of motion.  Respiratory: Patient is breathing without any difficulty.   NEUROLOGICAL:     Awake, alert, oriented to person, place, and time.  Speech is clear and fluent.   Cranial Nerves: Pupils equal round and reactive to light.  Facial tone is symmetric.  Facial sensation is symmetric. Shoulder shrug is symmetric. Tongue protrusion is midline.  There is no pronator drift.  Strength: Side Biceps Triceps Deltoid Interossei Grip Wrist Ext. Wrist Flex.  R 5 5 5 5 5 5 5   L 5 5 5 5 5 5 5    Side Iliopsoas Quads Hamstring PF DF EHL  R 5 5 5 5 5  5  L 5 5 5 5 5 5    Reflexes are ***2+ and symmetric at the biceps, triceps, brachioradialis, patella and achilles.   Hoffman's is absent.   Bilateral upper and lower extremity sensation is intact to light touch.    No evidence of dysmetria noted.  Gait is normal.     Medical Decision Making  Imaging: ***  I have personally reviewed the images and agree with the above interpretation.  Assessment and Plan: Mr. Bombara is a pleasant 72 y.o. male with ***    Thank you for involving me in the care of this patient.      Sandy Blouch K. Myer Haff MD, [facility] Neurosurgery

## 2022-09-22 ENCOUNTER — Ambulatory Visit: Payer: Medicare HMO | Admitting: Neurosurgery

## 2022-09-22 ENCOUNTER — Encounter: Payer: Self-pay | Admitting: Neurosurgery

## 2022-09-22 VITALS — BP 138/70 | Ht 69.0 in | Wt 207.0 lb

## 2022-09-22 DIAGNOSIS — M5416 Radiculopathy, lumbar region: Secondary | ICD-10-CM

## 2022-09-22 DIAGNOSIS — M4316 Spondylolisthesis, lumbar region: Secondary | ICD-10-CM | POA: Diagnosis not present

## 2022-09-22 DIAGNOSIS — M48062 Spinal stenosis, lumbar region with neurogenic claudication: Secondary | ICD-10-CM

## 2022-09-22 DIAGNOSIS — M419 Scoliosis, unspecified: Secondary | ICD-10-CM | POA: Diagnosis not present

## 2022-09-23 ENCOUNTER — Telehealth: Payer: Self-pay | Admitting: Cardiovascular Disease

## 2022-09-23 MED ORDER — AMOXICILLIN 500 MG PO TABS: 2000.0000 mg | ORAL_TABLET | ORAL | 2 refills | Status: DC

## 2022-09-23 NOTE — Telephone Encounter (Signed)
Pt requesting refill on non cardiac medication. Please advise.

## 2022-09-23 NOTE — Telephone Encounter (Signed)
*  STAT* If patient is at the pharmacy, call can be transferred to refill team.   1. Which medications need to be refilled? (please list name of each medication and dose if known)   amoxicillin (AMOXIL) 500 MG tablet     2. Would you like to learn more about the convenience, safety, & potential cost savings by using the [facility]? No   3. Are you open to using the [facility] (Type [facility]. ) No   4. Which pharmacy/location (including street and city if local pharmacy) is medication to be sent to?  [id number] - [place], [place] - [address] [place] [address]     5. Do they need a 30 day or 90 day supply? 12 tablets

## 2022-09-25 DIAGNOSIS — H34812 Central retinal vein occlusion, left eye, with macular edema: Secondary | ICD-10-CM | POA: Diagnosis not present

## 2022-10-02 DIAGNOSIS — M5416 Radiculopathy, lumbar region: Secondary | ICD-10-CM | POA: Diagnosis not present

## 2022-10-06 DIAGNOSIS — H5789 Other specified disorders of eye and adnexa: Secondary | ICD-10-CM | POA: Diagnosis not present

## 2022-10-06 DIAGNOSIS — E079 Disorder of thyroid, unspecified: Secondary | ICD-10-CM | POA: Diagnosis not present

## 2022-10-16 NOTE — Progress Notes (Unsigned)
Cardiology Office Note  Date:  10/19/2022   ID:  Frank, Mckee 1950/08/17, MRN [id number]  PCP:  Marina Goodell, MD   Chief Complaint  Patient presents with   Follow-up    5 month follow up, Pt feels well today. Medications reviewed verbally.     HPI:  [name] is a 72 year old gentleman with past medical history of Diabetes type 1, on insulin pump Hyperlipidemia Hypertension severe aortic valve stenosis, s/p TAVR Thyroid disease/Graves': started on intermittent infusions of Tepezza every 3 weeks  Who presents by referral from primary care for tachycardia, shortness of breath, hyper thyroidism,  severe aortic valve stenosis, s/p TAVR  Last seen by myself in clinic September 2023  s/p TAVR (05/27/21)   26 mm Edwards Sapien 3 Ultra Resilia THV via the TF approach Post operative echo showed EF 60%, normally functioning TAVR with a mean gradient of 14 mmHg and trivial PVL at the 2 o'clock NYHA class I symptoms, mean gradient down from more than 40  Last echocardiogram April 2024 At that time stable valve function with no significant gradient  Active on farm [place] SOB, no chest pain 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, 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  Does not check blood pressure at home Running in the 140s today Other outside clinic visits 130s Here ports other times it is run 120s  Remains on methimazole  EKG personally reviewed by myself on todays visit EKG Interpretation Date/Time:  Monday October 19 2022 07:59:57 EDT Ventricular Rate:  66 PR Interval:  154 QRS Duration:  84 QT Interval:  384 QTC Calculation: 402 R Axis:   -12  Text Interpretation: Normal sinus rhythm Septal infarct , age undetermined When compared with ECG of 28-May-2021 02:50, No significant change was found Confirmed by Julien Nordmann [phone]) on 10/19/2022 8:06:49 AM    Cardiac catheterization May 01, 2021   There is severe  aortic valve stenosis. 1.  Normal left dominant circulation; the right coronary artery could not be engaged selectively however nonselective injection demonstrated this to be small and nondominant. 2.  Normal cardiac output and index with normal filling pressures 3.  Severe aortic stenosis with peak to peak gradient of 80 mmHg. 4.  Capacious iliofemoral vessels bilaterally.    PMH:   has a past medical history of Diabetes mellitus without complication (HCC), Graves disease, Graves' eye disease, Hypercholesteremia, Hypertension, S/P TAVR (transcatheter aortic valve replacement) (05/27/2021), and Severe aortic stenosis.  PSH:    Past Surgical History:  Procedure Laterality Date   ABDOMINAL AORTOGRAM N/A 05/01/2021   Procedure: ABDOMINAL AORTOGRAM;  Surgeon: Orbie Pyo, MD;  [facility];  Service: Cardiovascular;  Laterality: N/A;   arm surgery     BACK SURGERY     COLONOSCOPY WITH PROPOFOL N/A 02/17/2017   Procedure: COLONOSCOPY WITH PROPOFOL;  Surgeon: Toledo, Boykin Nearing, MD;  Location: [facility] ENDOSCOPY;  Service: Gastroenterology;  Laterality: N/A;   ENDOSCOPIC CONCHA BULLOSA RESECTION Right 05/30/2020   Procedure: ENDOSCOPIC CONCHA BULLOSA RESECTION;  Surgeon: Vernie Murders, MD;  Location: [facility];  Service: ENT;  Laterality: Right;   EXCISION NASAL MASS Left 05/30/2020   Procedure: EXCISION NASAL MASS;  Surgeon: Vernie Murders, MD;  Location: [facility];  Service: ENT;  Laterality: Left;   EYE SURGERY     cataract extraction   INTRAOPERATIVE TRANSTHORACIC ECHOCARDIOGRAM N/A 05/27/2021   Procedure: INTRAOPERATIVE TRANSTHORACIC ECHOCARDIOGRAM;  Surgeon:  Orbie Pyo, MD;  Location: [facility] OR;  Service: Open Heart Surgery;  Laterality: N/A;   NASAL SEPTOPLASTY W/ TURBINOPLASTY N/A 05/30/2020   Procedure: NASAL SEPTOPLASTY WITH TURBINATE REDUCTION;  Surgeon: Vernie Murders, MD;  Location: [facility];  Service: ENT;  Laterality: N/A;   RIGHT/LEFT  HEART CATH AND CORONARY ANGIOGRAPHY N/A 05/01/2021   Procedure: RIGHT/LEFT HEART CATH AND CORONARY ANGIOGRAPHY;  Surgeon: Orbie Pyo, MD;  [facility];  Service: Cardiovascular;  Laterality: N/A;   SHOULDER SURGERY     TEE WITHOUT CARDIOVERSION N/A 03/01/2020   Procedure: TRANSESOPHAGEAL ECHOCARDIOGRAM (TEE);  Surgeon: Antonieta Iba, MD;  Location: [facility] ORS;  Service: Cardiovascular;  Laterality: N/A;   TRANSCATHETER AORTIC VALVE REPLACEMENT, TRANSFEMORAL N/A 05/27/2021   Procedure: Transcatheter Aortic Valve Replacement, Transfemoral;  Surgeon: Orbie Pyo, MD;  Location: [facility] OR;  Service: Open Heart Surgery;  Laterality: N/A;   ULTRASOUND GUIDANCE FOR VASCULAR ACCESS Bilateral 05/27/2021   Procedure: ULTRASOUND GUIDANCE FOR VASCULAR ACCESS;  Surgeon: Orbie Pyo, MD;  Location: [facility] OR;  Service: Open Heart Surgery;  Laterality: Bilateral;    Current Outpatient Medications  Medication Sig Dispense Refill   amoxicillin (AMOXIL) 500 MG tablet Take 4 tablets (2,000 mg total) by mouth as directed. 1 hour prior to dental work including cleanings 12 tablet 2   aspirin EC 81 MG tablet Take 81 mg by mouth daily. Swallow whole.     Continuous Glucose Sensor (DEXCOM G7 SENSOR) MISC Use     EPINEPHrine (EPIPEN 2-PAK) 0.3 mg/0.3 mL IJ SOAJ injection Inject 0.3 mg into the muscle as needed for anaphylaxis. 1 each 1   fexofenadine (ALLEGRA) 180 MG tablet Take 180 mg by mouth daily.     insulin aspart (NOVOLOG) 100 UNIT/ML injection INJECT UP TO 60 UNITS UNDER THE SKIN VIA PUMP AS DIRECTED     latanoprost (XALATAN) 0.005 % ophthalmic solution Place into the right eye.     losartan (COZAAR) 50 MG tablet TAKE 1 TABLET(50 MG) BY MOUTH DAILY 90 tablet 0   methimazole (TAPAZOLE) 10 MG tablet Take 10-15 mg by mouth See admin instructions. Take 10 mg by mouth every other day, alternating with 15 mg on alternate days     Multiple Vitamin (MULTIVITAMIN) tablet Take 1 tablet by mouth  daily.     rosuvastatin (CRESTOR) 5 MG tablet Take 5 mg by mouth daily.     tamsulosin (FLOMAX) 0.4 MG CAPS capsule Take 0.4 mg by mouth every evening.     triamcinolone cream (KENALOG) 0.1 % Apply 1 application. topically daily as needed (Rash).     No current facility-administered medications for this visit.    Allergies:   Atorvastatin and Ezetimibe   Social History:  The patient  reports that he has never smoked. He has never used smokeless tobacco. He reports that he does not drink alcohol and does not use drugs.   Family History:   family history includes Diabetes in his father; Healthy in his mother; Hypertension in his father.    Review of Systems: Review of Systems  Constitutional: Negative.   HENT: Negative.    Respiratory: Negative.    Cardiovascular: Negative.   Gastrointestinal: Negative.   Musculoskeletal: Negative.   Neurological: Negative.   Psychiatric/Behavioral: Negative.    All other systems reviewed and are negative.   PHYSICAL EXAM: VS:  BP (!) 144/64 (BP Location: Left Arm, Patient Position: Supine, Cuff Size: Normal)   Pulse 66   Ht 5\' 9"  (  1.753 m)   Wt 202 lb 12.8 oz (92 kg)   SpO2 92%   BMI 29.95 kg/m  , BMI Body mass index is 29.95 kg/m. Constitutional:  oriented to person, place, and time. No distress.  HENT:  Head: Grossly normal Eyes:  no discharge. No scleral icterus.  Neck: No JVD, no carotid bruits  Cardiovascular: Regular rate and rhythm, 1/6 SEM right sternal border Pulmonary/Chest: Clear to auscultation bilaterally, no wheezes or rails Abdominal: Soft.  no distension.  no tenderness.  Musculoskeletal: Normal range of motion Neurological:  normal muscle tone. Coordination normal. No atrophy Skin: Skin warm and dry Psychiatric: normal affect, pleasant   Recent Labs: No results found for requested labs within last 365 days.    Lipid Panel No results found for: "CHOL", "HDL", "LDLCALC", "TRIG"    Wt Readings from Last 3  Encounters:  10/19/22 202 lb 12.8 oz (92 kg)  09/22/22 207 lb (93.9 kg)  05/22/22 205 lb 9.6 oz (93.3 kg)     ASSESSMENT AND PLAN:  Problem List Items Addressed This Visit       Cardiology Problems   Severe aortic stenosis - Primary     Other   S/P TAVR (transcatheter aortic valve replacement)   Other Visit Diagnoses     Essential hypertension       Graves' ophthalmopathy       Diabetes mellitus type 2, insulin dependent (HCC)       Paroxysmal tachycardia (HCC)       Relevant Orders   EKG 12-Lead (Completed)   Mixed hyperlipidemia          Hyperthyroidism/Graves' disease,  Followed by endocrinology Remains on methimazole  Essential hypertension Blood pressure mildly elevated, recommend he monitor blood pressure at home and call us with numbers If evaded could increase losartan up to 50 twice daily or 100 daily  Tachycardia Tachycardia improved with treatment of hyperthyroidism Currently not on beta-blocker, rate in the 60s   severe aortic valve stenosis Severe by TTE, underwent TAVR April 2023 Feels he has more energy, gradient decreased down to 14 mmHg from greater than 40, asymptomatic Most recent echo April 2024  Hyperlipidemia CT scan  minimal coronary calcification, minimal aortic atherosclerosis On statin, cholesterol close to goal  Diabetes type 2 on insulin We have encouraged continued exercise, careful diet management in an effort to lose weight.  Lower extremity edema No significant leg edema   Total encounter time more than 30 minutes  Greater than 50% was spent in counseling and coordination of care with the patient   Signed, Dossie Arbour, M.D., Ph.D. [facility] [place], [place] [phone]

## 2022-10-19 ENCOUNTER — Ambulatory Visit: Payer: Medicare HMO | Attending: Cardiovascular Disease | Admitting: Cardiovascular Disease

## 2022-10-19 ENCOUNTER — Encounter: Payer: Self-pay | Admitting: Cardiovascular Disease

## 2022-10-19 VITALS — BP 144/64 | HR 66 | Ht 69.0 in | Wt 202.8 lb

## 2022-10-19 DIAGNOSIS — I350 Nonrheumatic aortic (valve) stenosis: Secondary | ICD-10-CM

## 2022-10-19 DIAGNOSIS — Z952 Presence of prosthetic heart valve: Secondary | ICD-10-CM | POA: Diagnosis not present

## 2022-10-19 DIAGNOSIS — E0500 Thyrotoxicosis with diffuse goiter without thyrotoxic crisis or storm: Secondary | ICD-10-CM | POA: Diagnosis not present

## 2022-10-19 DIAGNOSIS — E119 Type 2 diabetes mellitus without complications: Secondary | ICD-10-CM | POA: Diagnosis not present

## 2022-10-19 DIAGNOSIS — I10 Essential (primary) hypertension: Secondary | ICD-10-CM

## 2022-10-19 DIAGNOSIS — I479 Paroxysmal tachycardia, unspecified: Secondary | ICD-10-CM | POA: Diagnosis not present

## 2022-10-19 DIAGNOSIS — E782 Mixed hyperlipidemia: Secondary | ICD-10-CM

## 2022-10-19 DIAGNOSIS — Z794 Long term (current) use of insulin: Secondary | ICD-10-CM | POA: Diagnosis not present

## 2022-10-19 MED ORDER — LOSARTAN POTASSIUM 50 MG PO TABS: ORAL_TABLET | ORAL | 3 refills | Status: DC

## 2022-10-19 NOTE — Patient Instructions (Signed)
Please monitor blood pressure at home Call if BP >140  Medication Instructions:  No changes  If you need a refill on your cardiac medications before your next appointment, please call your pharmacy.   Lab work: No new labs needed  Testing/Procedures: No new testing needed  Follow-Up: At [facility], you and your health needs are our priority.  As part of our continuing mission to provide you with exceptional heart care, we have created designated Provider Care Teams.  These Care Teams include your primary Cardiologist (physician) and Advanced Practice Providers (APPs -  Physician Assistants and Nurse Practitioners) who all work together to provide you with the care you need, when you need it.  You will need a follow up appointment in 12 months  Providers on your designated Care Team:   Nicolasa Ducking, NP Eula Listen, PA-C Cadence Fransico [place], [place]  COVID-19 Vaccine Information can be found at: [email or web address] For questions related to vaccine distribution or appointments, please email [email or web address] or call [phone].

## 2022-10-26 DIAGNOSIS — I152 Hypertension secondary to endocrine disorders: Secondary | ICD-10-CM | POA: Diagnosis not present

## 2022-10-26 DIAGNOSIS — E1069 Type 1 diabetes mellitus with other specified complication: Secondary | ICD-10-CM | POA: Diagnosis not present

## 2022-10-26 DIAGNOSIS — E1029 Type 1 diabetes mellitus with other diabetic kidney complication: Secondary | ICD-10-CM | POA: Diagnosis not present

## 2022-10-26 DIAGNOSIS — E785 Hyperlipidemia, unspecified: Secondary | ICD-10-CM | POA: Diagnosis not present

## 2022-10-26 DIAGNOSIS — R809 Proteinuria, unspecified: Secondary | ICD-10-CM | POA: Diagnosis not present

## 2022-10-26 DIAGNOSIS — E0590 Thyrotoxicosis, unspecified without thyrotoxic crisis or storm: Secondary | ICD-10-CM | POA: Diagnosis not present

## 2022-11-02 DIAGNOSIS — E1029 Type 1 diabetes mellitus with other diabetic kidney complication: Secondary | ICD-10-CM | POA: Diagnosis not present

## 2022-11-12 DIAGNOSIS — Z23 Encounter for immunization: Secondary | ICD-10-CM | POA: Diagnosis not present

## 2022-11-19 DIAGNOSIS — M5416 Radiculopathy, lumbar region: Secondary | ICD-10-CM | POA: Diagnosis not present

## 2022-11-26 ENCOUNTER — Ambulatory Visit (INDEPENDENT_AMBULATORY_CARE_PROVIDER_SITE_OTHER): Payer: Medicare HMO | Admitting: Neurosurgery

## 2022-11-26 DIAGNOSIS — M48062 Spinal stenosis, lumbar region with neurogenic claudication: Secondary | ICD-10-CM

## 2022-11-26 DIAGNOSIS — M419 Scoliosis, unspecified: Secondary | ICD-10-CM

## 2022-11-26 DIAGNOSIS — M5416 Radiculopathy, lumbar region: Secondary | ICD-10-CM

## 2022-11-26 DIAGNOSIS — M4316 Spondylolisthesis, lumbar region: Secondary | ICD-10-CM
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
## 2022-12-02 DIAGNOSIS — E1029 Type 1 diabetes mellitus with other diabetic kidney complication: Secondary | ICD-10-CM | POA: Diagnosis not present

## 2022-12-29 DIAGNOSIS — I350 Nonrheumatic aortic (valve) stenosis: Secondary | ICD-10-CM | POA: Diagnosis not present

## 2022-12-29 DIAGNOSIS — Z0000 Encounter for general adult medical examination without abnormal findings: Secondary | ICD-10-CM | POA: Diagnosis not present

## 2022-12-29 DIAGNOSIS — I10 Essential (primary) hypertension: Secondary | ICD-10-CM | POA: Diagnosis not present

## 2022-12-29 DIAGNOSIS — E0590 Thyrotoxicosis, unspecified without thyrotoxic crisis or storm: Secondary | ICD-10-CM | POA: Diagnosis not present

## 2022-12-29 DIAGNOSIS — M5416 Radiculopathy, lumbar region: Secondary | ICD-10-CM | POA: Diagnosis not present

## 2022-12-29 DIAGNOSIS — E785 Hyperlipidemia, unspecified: Secondary | ICD-10-CM | POA: Diagnosis not present

## 2022-12-29 DIAGNOSIS — Z125 Encounter for screening for malignant neoplasm of prostate: Secondary | ICD-10-CM | POA: Diagnosis not present

## 2022-12-29 DIAGNOSIS — E109 Type 1 diabetes mellitus without complications: Secondary | ICD-10-CM | POA: Diagnosis not present

## 2022-12-29 DIAGNOSIS — N400 Enlarged prostate without lower urinary tract symptoms: Secondary | ICD-10-CM | POA: Diagnosis not present

## 2022-12-29 DIAGNOSIS — Z1331 Encounter for screening for depression: Secondary | ICD-10-CM | POA: Diagnosis not present

## 2023-01-02 DIAGNOSIS — E1029 Type 1 diabetes mellitus with other diabetic kidney complication: Secondary | ICD-10-CM | POA: Diagnosis not present

## 2023-01-04 DIAGNOSIS — H34812 Central retinal vein occlusion, left eye, with macular edema: Secondary | ICD-10-CM | POA: Diagnosis not present

## 2023-01-12 DIAGNOSIS — E079 Disorder of thyroid, unspecified: Secondary | ICD-10-CM | POA: Diagnosis not present

## 2023-01-12 DIAGNOSIS — H5789 Other specified disorders of eye and adnexa: Secondary | ICD-10-CM | POA: Diagnosis not present

## 2023-01-13 DIAGNOSIS — M5416 Radiculopathy, lumbar region: Secondary | ICD-10-CM | POA: Diagnosis not present

## 2023-02-01 DIAGNOSIS — E1029 Type 1 diabetes mellitus with other diabetic kidney complication: Secondary | ICD-10-CM | POA: Diagnosis not present

## 2023-02-09 ENCOUNTER — Ambulatory Visit: Payer: Medicare HMO | Admitting: Neurosurgery

## 2023-02-18 ENCOUNTER — Ambulatory Visit: Payer: Medicare HMO | Admitting: Neurosurgery

## 2023-02-18 VITALS — BP 144/80 | Ht 69.0 in | Wt 209.6 lb

## 2023-02-18 DIAGNOSIS — M5416 Radiculopathy, lumbar region: Secondary | ICD-10-CM | POA: Diagnosis not present

## 2023-02-18 DIAGNOSIS — M4185 Other forms of scoliosis, thoracolumbar region: Secondary | ICD-10-CM

## 2023-02-18 DIAGNOSIS — M419 Scoliosis, unspecified: Secondary | ICD-10-CM

## 2023-02-18 DIAGNOSIS — M48062 Spinal stenosis, lumbar region with neurogenic claudication: Secondary | ICD-10-CM

## 2023-02-18 DIAGNOSIS — M4316 Spondylolisthesis, lumbar region: Secondary | ICD-10-CM | POA: Diagnosis not present

## 2023-02-18 NOTE — Progress Notes (Signed)
Referring Physician:  Marina Goodell, MD [address] [place],  [place] [address]  Primary Physician:  Marina Goodell, MD  History of Present Illness: 02/18/2023 [name] is doing well.  He has been taking medication for his eyes which have helped with some of his symptoms.  He has had injections which have helped.  He is currently not very symptomatic.  11/26/2022 [name] returns today for telephone visit.  He is still having back pain, but it is not any worse than previous.   09/22/2022 [name] is here today with a chief complaint of back and leg pain.  His back pain is worse than his leg pain.  He mostly gets right leg pain, but does get left leg pain at times.  He has had many years of pain but is worsened over the past several months.  There are days when he is able to do most things he needs to do, and other days where he has significant pains.  He denies numbness and tingling.  Walking and standing in 1 position make his pain worse.  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  Laying on his back improves his pain.  He is impacted daily.  Bowel/Bladder Dysfunction: none  Conservative measures:  Physical therapy:  has participated in at [facility] from 07/16/22 to 08/12/22 Multimodal medical therapy including regular antiinflammatories:  Ibuprofen,Tylenol, gabapentin  Injections:  has not received any epidural steroid injections  Past Surgery: yes Lumbar Surgery over 30 years ago, Dr. Dorisann Frames Neck Surgery 35 years ago, Dr. Azell Der has no symptoms of cervical myelopathy.  The symptoms are causing a significant impact on the patient's life.   I have utilized the care everywhere function in epic to review the outside records available from external health systems.  Review of Systems:  A 10 point review of systems is negative, except for the pertinent positives and  negatives detailed in the HPI.  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 Lynnette Caffey & Dr. Laneta Simmers   Severe aortic stenosis     Past Surgical History: Past Surgical History:  Procedure Laterality Date   ABDOMINAL AORTOGRAM N/A 05/01/2021   Procedure: ABDOMINAL AORTOGRAM;  Surgeon: Orbie Pyo, MD;  [facility];  Service: Cardiovascular;  Laterality: N/A;   arm surgery     BACK SURGERY     COLONOSCOPY WITH PROPOFOL N/A 02/17/2017   Procedure: COLONOSCOPY WITH PROPOFOL;  Surgeon: Toledo, Boykin Nearing, MD;  Location: [facility] ENDOSCOPY;  Service: Gastroenterology;  Laterality: N/A;   ENDOSCOPIC CONCHA BULLOSA RESECTION Right 05/30/2020   Procedure: ENDOSCOPIC CONCHA BULLOSA RESECTION;  Surgeon: Vernie Murders, MD;  Location: [facility];  Service: ENT;  Laterality: Right;   EXCISION NASAL MASS Left 05/30/2020   Procedure: EXCISION NASAL MASS;  Surgeon: Vernie Murders, MD;  Location: [facility];  Service: ENT;  Laterality: Left;   EYE SURGERY     cataract extraction   INTRAOPERATIVE TRANSTHORACIC ECHOCARDIOGRAM N/A 05/27/2021   Procedure: INTRAOPERATIVE TRANSTHORACIC ECHOCARDIOGRAM;  Surgeon: Orbie Pyo, MD;  Location: [facility]  OR;  Service: Open Heart Surgery;  Laterality: N/A;   NASAL SEPTOPLASTY W/ TURBINOPLASTY N/A 05/30/2020   Procedure: NASAL SEPTOPLASTY WITH TURBINATE REDUCTION;  Surgeon: Vernie Murders, MD;  Location: [facility];  Service: ENT;  Laterality: N/A;   RIGHT/LEFT HEART CATH AND CORONARY ANGIOGRAPHY N/A 05/01/2021   Procedure: RIGHT/LEFT HEART CATH AND CORONARY ANGIOGRAPHY;  Surgeon: Orbie Pyo, MD;  [facility];  Service: Cardiovascular;   Laterality: N/A;   SHOULDER SURGERY     TEE WITHOUT CARDIOVERSION N/A 03/01/2020   Procedure: TRANSESOPHAGEAL ECHOCARDIOGRAM (TEE);  Surgeon: Antonieta Iba, MD;  Location: [facility] ORS;  Service: Cardiovascular;  Laterality: N/A;   TRANSCATHETER AORTIC VALVE REPLACEMENT, TRANSFEMORAL N/A 05/27/2021   Procedure: Transcatheter Aortic Valve Replacement, Transfemoral;  Surgeon: Orbie Pyo, MD;  Location: [facility] OR;  Service: Open Heart Surgery;  Laterality: N/A;   ULTRASOUND GUIDANCE FOR VASCULAR ACCESS Bilateral 05/27/2021   Procedure: ULTRASOUND GUIDANCE FOR VASCULAR ACCESS;  Surgeon: Orbie Pyo, MD;  Location: [facility] OR;  Service: Open Heart Surgery;  Laterality: Bilateral;    Allergies: Allergies as of 02/18/2023 - Review Complete 02/18/2023  Allergen Reaction Noted   Atorvastatin Other (See Comments) 08/15/2018   Ezetimibe Other (See Comments) 10/15/2020    Medications:  Current Outpatient Medications:    amoxicillin (AMOXIL) 500 MG tablet, Take 4 tablets (2,000 mg total) by mouth as directed. 1 hour prior to dental work including cleanings, Disp: 12 tablet, Rfl: 2   aspirin EC 81 MG tablet, Take 81 mg by mouth daily. Swallow whole., Disp: , Rfl:    Continuous Glucose Sensor (DEXCOM G7 SENSOR) MISC, Use, Disp: , Rfl:    EPINEPHrine (EPIPEN 2-PAK) 0.3 mg/0.3 mL IJ SOAJ injection, Inject 0.3 mg into the muscle as needed for anaphylaxis., Disp: 1 each, Rfl: 1   fexofenadine (ALLEGRA) 180 MG tablet, Take 180 mg by mouth daily., Disp: , Rfl:    insulin aspart (NOVOLOG) 100 UNIT/ML injection, INJECT UP TO 60 UNITS UNDER THE SKIN VIA PUMP AS DIRECTED, Disp: , Rfl:    latanoprost (XALATAN) 0.005 % ophthalmic solution, Place into the right eye., Disp: , Rfl:    losartan (COZAAR) 50 MG tablet, TAKE 1 TABLET(50 MG) BY MOUTH DAILY, Disp: 90 tablet, Rfl: 3   methimazole (TAPAZOLE) 10 MG tablet, Take 10-15 mg by mouth. Take 10 mg by mouth every Sunday alternating with 5 mg on all other days, Disp: ,  Rfl:    Multiple Vitamin (MULTIVITAMIN) tablet, Take 1 tablet by mouth daily., Disp: , Rfl:    rosuvastatin (CRESTOR) 5 MG tablet, Take 5 mg by mouth daily., Disp: , Rfl:    tamsulosin (FLOMAX) 0.4 MG CAPS capsule, Take 0.4 mg by mouth every evening., Disp: , Rfl:    triamcinolone cream (KENALOG) 0.1 %, Apply 1 application. topically daily as needed (Rash)., Disp: , Rfl:   Social History: Social History   Tobacco Use   Smoking status: Never   Smokeless tobacco: Never  Vaping Use   Vaping status: Never Used  Substance Use Topics   Alcohol use: No   Drug use: No    Family Medical History: Family History  Problem Relation Age of Onset   Healthy Mother    Diabetes Father    Hypertension Father     Physical Examination: 02/18/2023  CNI AAOx3 5/5 throughout BUE and BLE SILT    Medical Decision Making  Imaging: MRI lumbar spine on July 30, 2022 shows severe right and moderate left exiting  and mild right descending nerve impingement at L5-S1.  Moderate right greater than left exiting and moderate right descending nerve root impingement at L4-5.  Moderate right greater left exiting and bilateral descending nerve root impingement at L3-4.  Severe left greater than right descending and moderate bilateral exiting nerve root impingement at L2-3.  Moderate right greater than left exiting and severe right greater than left descending nerve root impingement at L1-2.  He has a 19 degree coronal curvature between L1 and L3.  He has approximately 40 degree lumbar lordosis.  There is 6 mm L5 and S1 anterolisthesis and extension that increases to 9 mm on flexion.  I have personally reviewed the images and agree with the above interpretation.  Assessment and Plan: Frank Mckee is a pleasant 73 y.o. male with thoracolumbar scoliosis with substantial lumbar spondylosis as well as an anterolisthesis at L5-S1.  He has significant nerve compression at multiple levels.  He is currently doing better.   He is not ready to consider any interventions.  He will continue his current plan.  I will see him back in approximately 4 months.  I spent a total of 15 minutes in this patient's care today. This time was spent reviewing pertinent records including imaging studies, obtaining and confirming history, performing a directed evaluation, formulating and discussing my recommendations, and documenting the visit within the medical record.   The patient is aware of and accepts the limits of this telehealth visit.      Nishan Ovens K. Myer Haff MD, [facility] Neurosurgery

## 2023-02-20 DIAGNOSIS — E1029 Type 1 diabetes mellitus with other diabetic kidney complication: Secondary | ICD-10-CM | POA: Diagnosis not present

## 2023-03-01 DIAGNOSIS — E1029 Type 1 diabetes mellitus with other diabetic kidney complication: Secondary | ICD-10-CM | POA: Diagnosis not present

## 2023-03-01 DIAGNOSIS — I152 Hypertension secondary to endocrine disorders: Secondary | ICD-10-CM | POA: Diagnosis not present

## 2023-03-01 DIAGNOSIS — E785 Hyperlipidemia, unspecified: Secondary | ICD-10-CM | POA: Diagnosis not present

## 2023-03-01 DIAGNOSIS — E1069 Type 1 diabetes mellitus with other specified complication: Secondary | ICD-10-CM | POA: Diagnosis not present

## 2023-03-01 DIAGNOSIS — E0590 Thyrotoxicosis, unspecified without thyrotoxic crisis or storm: Secondary | ICD-10-CM | POA: Diagnosis not present

## 2023-03-01 DIAGNOSIS — R809 Proteinuria, unspecified: Secondary | ICD-10-CM | POA: Diagnosis not present

## 2023-03-03 DIAGNOSIS — R1312 Dysphagia, oropharyngeal phase: Secondary | ICD-10-CM | POA: Diagnosis not present

## 2023-03-04 DIAGNOSIS — E1029 Type 1 diabetes mellitus with other diabetic kidney complication: Secondary | ICD-10-CM | POA: Diagnosis not present

## 2023-03-12 ENCOUNTER — Other Ambulatory Visit: Payer: Self-pay | Admitting: Internal Medicine

## 2023-03-12 DIAGNOSIS — R1312 Dysphagia, oropharyngeal phase: Secondary | ICD-10-CM

## 2023-03-16 ENCOUNTER — Other Ambulatory Visit: Payer: Self-pay | Admitting: Cardiovascular Disease

## 2023-03-23 DIAGNOSIS — Z125 Encounter for screening for malignant neoplasm of prostate: Secondary | ICD-10-CM | POA: Diagnosis not present

## 2023-03-23 DIAGNOSIS — R972 Elevated prostate specific antigen [PSA]: Secondary | ICD-10-CM | POA: Diagnosis not present

## 2023-03-24 DIAGNOSIS — E1029 Type 1 diabetes mellitus with other diabetic kidney complication: Secondary | ICD-10-CM | POA: Diagnosis not present

## 2023-03-26 ENCOUNTER — Other Ambulatory Visit: Payer: Medicare HMO

## 2023-04-02 ENCOUNTER — Ambulatory Visit
Admission: RE | Admit: 2023-04-02 | Discharge: 2023-04-02 | Disposition: A | Discharge status: Home / Self Care | Payer: Medicare HMO | Source: Ambulatory Visit | Attending: Internal Medicine | Admitting: Internal Medicine

## 2023-04-02 DIAGNOSIS — R09A2 Foreign body sensation, throat: Secondary | ICD-10-CM | POA: Diagnosis not present

## 2023-04-02 DIAGNOSIS — R1312 Dysphagia, oropharyngeal phase: Secondary | ICD-10-CM

## 2023-04-02 DIAGNOSIS — E1029 Type 1 diabetes mellitus with other diabetic kidney complication: Secondary | ICD-10-CM | POA: Diagnosis not present

## 2023-04-02 DIAGNOSIS — K449 Diaphragmatic hernia without obstruction or gangrene: Secondary | ICD-10-CM | POA: Diagnosis not present

## 2023-04-02 DIAGNOSIS — K224 Dyskinesia of esophagus: Secondary | ICD-10-CM | POA: Diagnosis not present

## 2023-04-20 DIAGNOSIS — H5789 Other specified disorders of eye and adnexa: Secondary | ICD-10-CM | POA: Diagnosis not present

## 2023-04-20 DIAGNOSIS — E079 Disorder of thyroid, unspecified: Secondary | ICD-10-CM | POA: Diagnosis not present

## 2023-04-27 IMAGING — CT CT HEART MORP W/ CTA COR W/ SCORE W/ CA W/CM &/OR W/O CM
1 series · 13 of 20 positions shown, 17 images · non-contrast
Comparison: 12/07/2014 chest CT.
COMPARISON: 12/07/2014 chest CT.

Addendum:
EXAM:
OVER-READ INTERPRETATION  CT CHEST

The following report is an over-read performed by radiologist Dr.
Aldevina Tabanie [[email or web address]] on 05/20/2021. This over-read
does not include interpretation of cardiac or coronary anatomy or
pathology. The coronary CTA interpretation by the cardiologist is
attached.
CLINICAL DATA: Severe Aortic Stenosis.
Cardiac TAVR CT
TECHNIQUE: A non-contrast, gated CT scan was obtained with axial slices of 3 mm
through the heart for aortic valve calcium scoring. A 120 kV
retrospective, gated, contrast cardiac scan was obtained. Gantry
rotation speed was 250 msecs and collimation was 0.6 mm.
Nitroglycerin was not given. The 3D data set was reconstructed in 5%
intervals of the 0-95% of the R-R cycle. Systolic and diastolic
phases were analyzed on a dedicated workstation using MPR, MIP, and
VRT modes. The patient received 100 cc of contrast.

[Series 3542: findings · 0.23mm/px · 13 of 22 slices shown, 17 images]
[im 2/22  vessel]
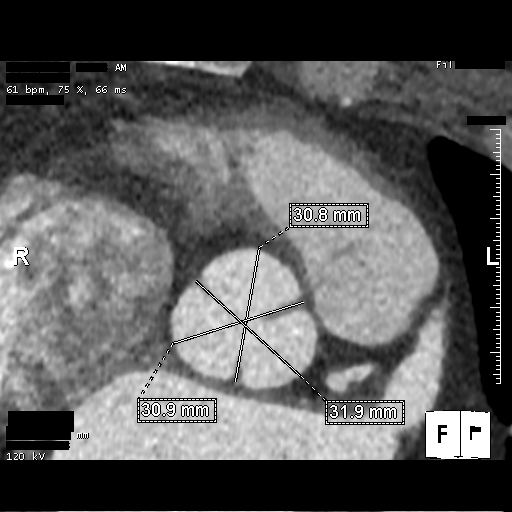
[im 2/22  lung]
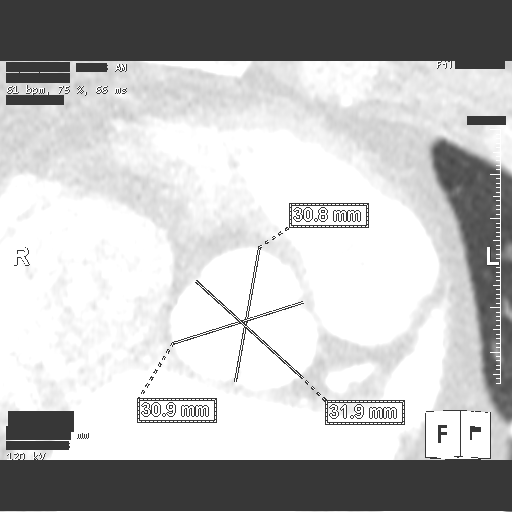
[im 4/22  vessel]
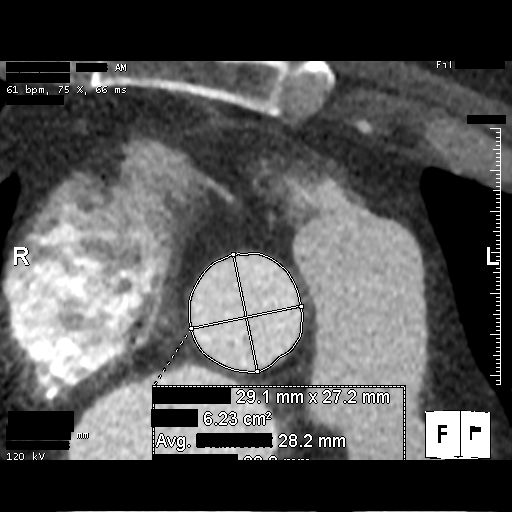
[im 5/22  vessel]
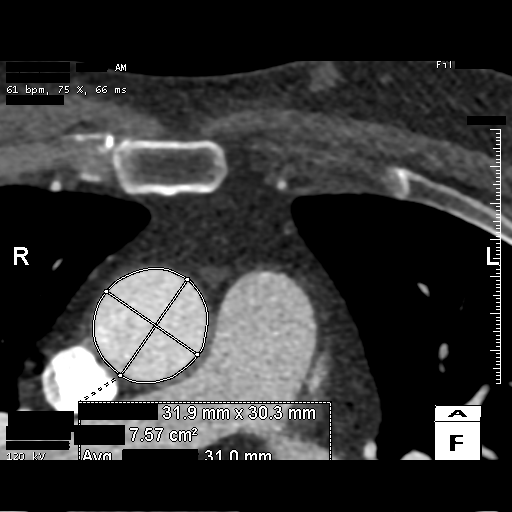
[im 6/22  vessel]
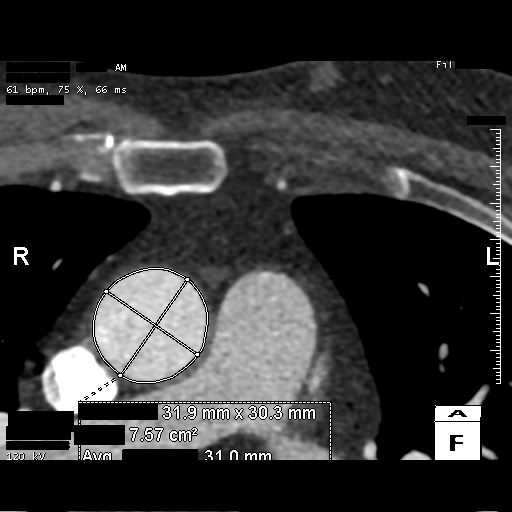
[im 8/22  vessel]
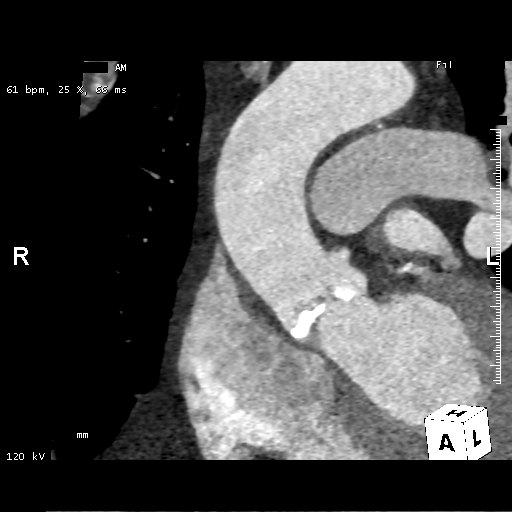
[im 8/22  lung]
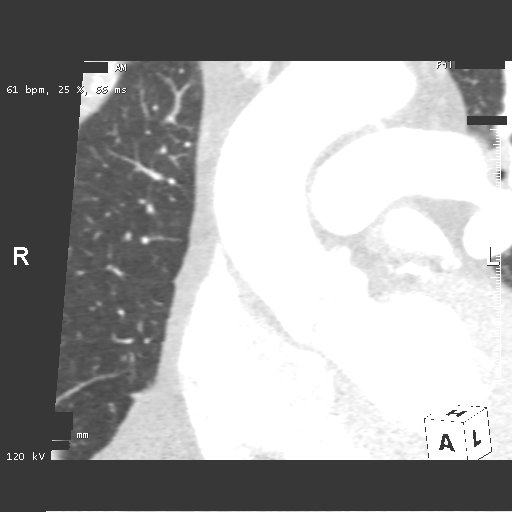
[im 9/22  vessel]
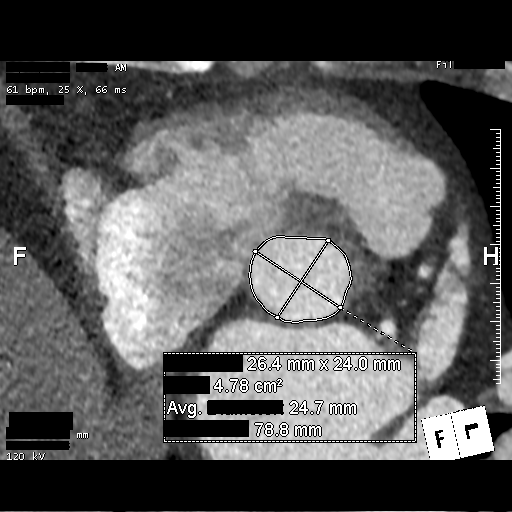
[im 12/22  vessel]
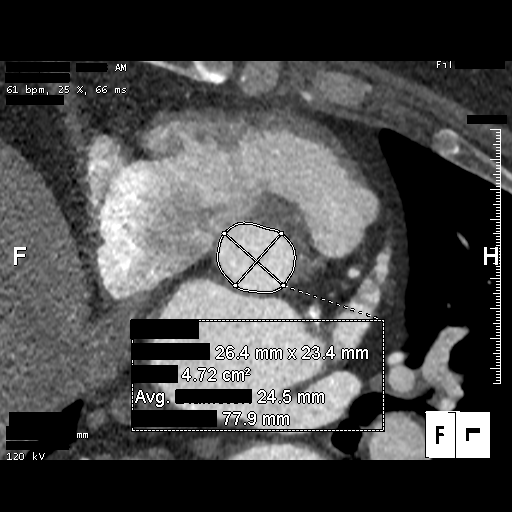
[im 13/22  vessel]
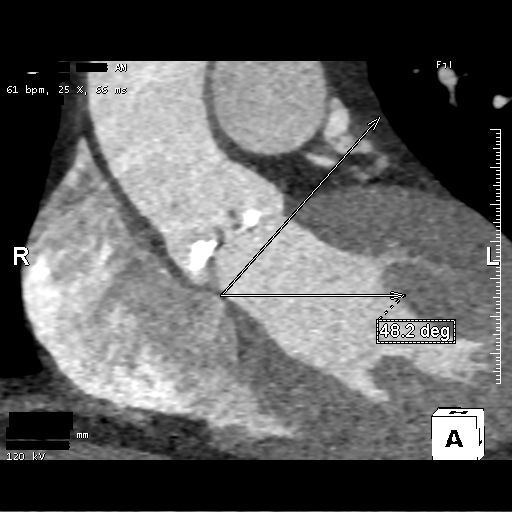
[im 14/22  vessel]
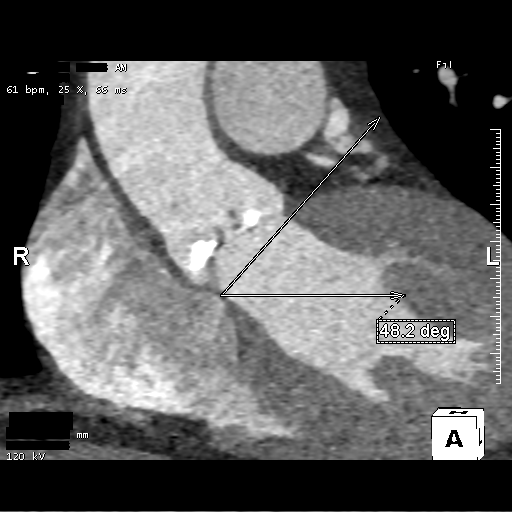
[im 14/22  lung]
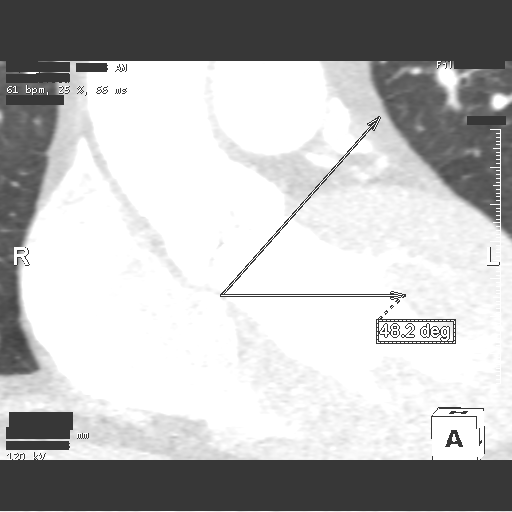
[im 16/22  vessel]
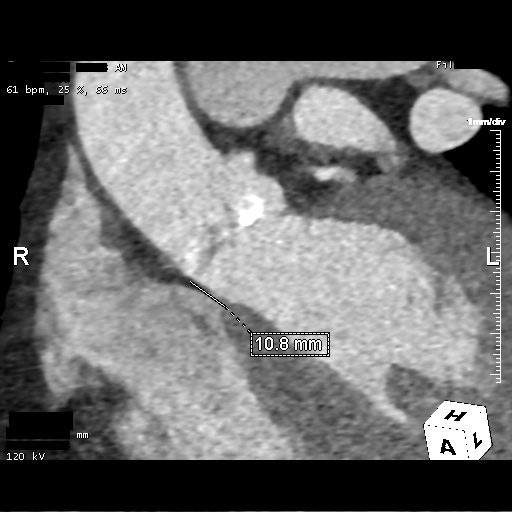
[im 17/22  vessel]
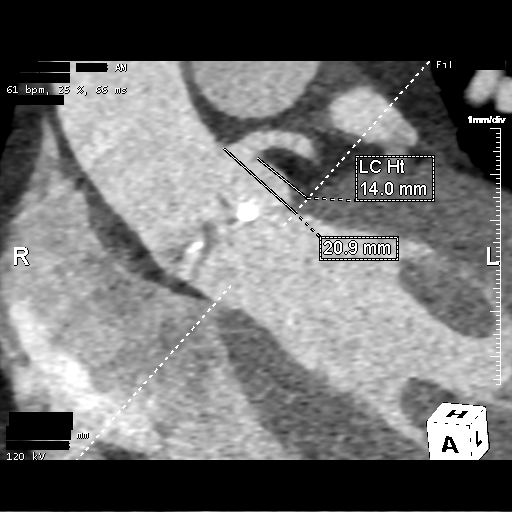
[im 18/22  vessel]
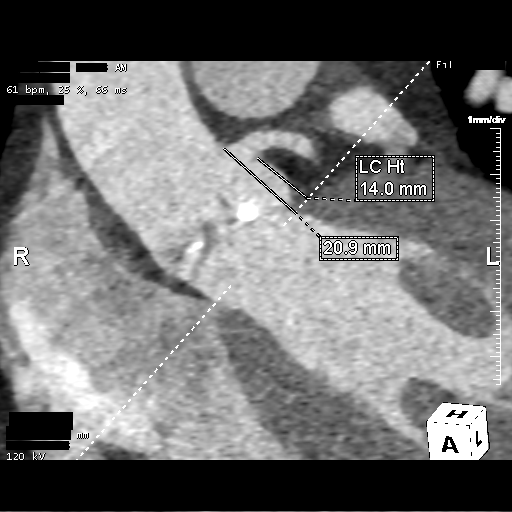
[im 20/22  vessel]
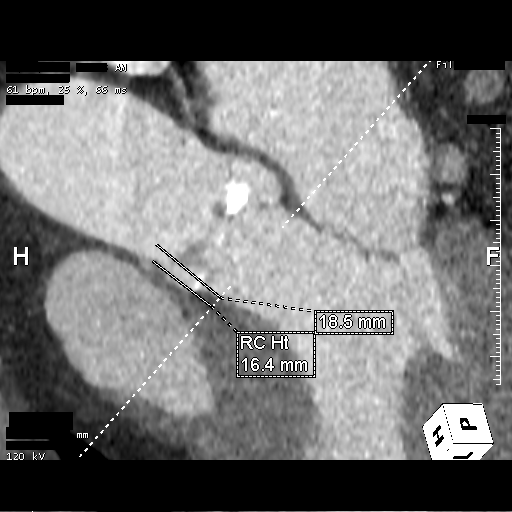
[im 20/22  lung]
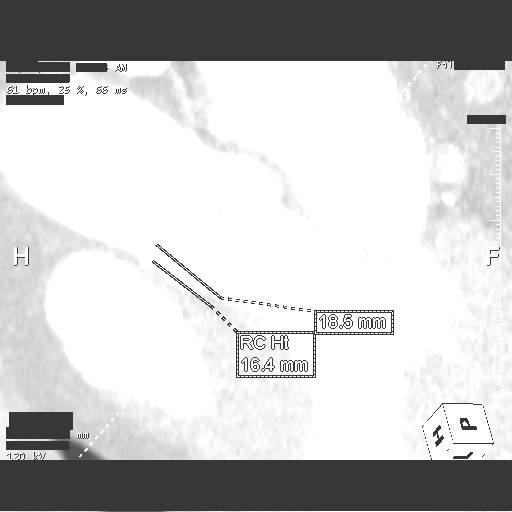

[13 of 20 positions shown; findings below may reference images not displayed]

FINDINGS: Please see the separate concurrent chest CT angiogram report for
details.
IMPRESSION: Please see the separate concurrent chest CT angiogram report for
details.
FINDINGS: Image quality: Excellent.

Noise artifact is: Limited.

Valve Morphology: Tricuspid aortic valve with severe calcifications.
Severe bulky calcification of the NCC/LCC.

Aortic Valve Calcium score: 4404

Aortic annular dimension:

Phase assessed: 25%

Annular area: 472 mm2

Annular perimeter: 77.9 mm

Max diameter: 26.4 mm

Min diameter: 23.4 mm

Annular and subannular calcification: None.

Membranous septum length: 10.8 mm

Optimal coplanar projection: LAO 9 HELLZEQ 10

Coronary Artery Height above Annulus:

Left Main: 14.0 mm

Right Coronary: 16.4 mm

Sinus of Valsalva Measurements:

Non-coronary: 31 mm

Right-coronary: 31 mm

Left-coronary: 32 mm

Sinus of Valsalva Height:

Non-coronary: 21.1 mm

Right-coronary: 18.5 mm

Left-coronary: 20.9 mm

Sinotubular Junction: 29 mm

Ascending Thoracic Aorta: 32 mm

Coronary Arteries: Normal coronary origin. Left dominance. The study
was performed without use of NTG and is insufficient for plaque
evaluation. Please refer to recent cardiac catheterization for
coronary assessment.

Cardiac Morphology:

Right Atrium: Right atrial size is within normal limits.

Right Ventricle: The right ventricular cavity is within normal
limits.

Left Atrium: Left atrial size is normal in size with no left atrial
appendage filling defect.

Left Ventricle: The ventricular cavity size is within normal limits.
There are no stigmata of prior infarction. There is no abnormal
filling defect. Normal left ventricular function, LVEF=68%. No
regional wall motion abnormalities.

Pulmonary arteries: Normal in size without proximal filling defect.

Pulmonary veins: Normal pulmonary venous drainage.

Pericardium: Normal thickness with no significant effusion or
calcium present.

Mitral Valve: The mitral valve is normal structure with mild annular
calcium.

Extra-cardiac findings: See attached radiology report for
non-cardiac structures.
IMPRESSION: 1. Tricuspid aortic valve with severe aortic stenosis.

2. Annular measurements appropriate for 26 mm S3 TAVR (472 mm2).

3. No significant annular or subannular calcifications.

4. Sufficient coronary to annulus distance.

5. Optimal Fluoroscopic Angle for Delivery: LAO 9 HELLZEQ 10

*** End of Addendum ***
EXAM:
OVER-READ INTERPRETATION  CT CHEST

The following report is an over-read performed by radiologist Dr.
Aldevina Tabanie [[email or web address]] on 05/20/2021. This over-read
does not include interpretation of cardiac or coronary anatomy or
pathology. The coronary CTA interpretation by the cardiologist is
attached.
FINDINGS: Please see the separate concurrent chest CT angiogram report for
details.
IMPRESSION: Please see the separate concurrent chest CT angiogram report for
details.

## 2023-04-29 DIAGNOSIS — L578 Other skin changes due to chronic exposure to nonionizing radiation: Secondary | ICD-10-CM | POA: Diagnosis not present

## 2023-04-29 DIAGNOSIS — Z85828 Personal history of other malignant neoplasm of skin: Secondary | ICD-10-CM | POA: Diagnosis not present

## 2023-04-29 DIAGNOSIS — L218 Other seborrheic dermatitis: Secondary | ICD-10-CM | POA: Diagnosis not present

## 2023-04-29 DIAGNOSIS — L738 Other specified follicular disorders: Secondary | ICD-10-CM | POA: Diagnosis not present

## 2023-04-29 DIAGNOSIS — Z872 Personal history of diseases of the skin and subcutaneous tissue: Secondary | ICD-10-CM | POA: Diagnosis not present

## 2023-04-29 DIAGNOSIS — Z86018 Personal history of other benign neoplasm: Secondary | ICD-10-CM | POA: Diagnosis not present

## 2023-04-29 DIAGNOSIS — L720 Epidermal cyst: Secondary | ICD-10-CM | POA: Diagnosis not present

## 2023-04-29 DIAGNOSIS — L570 Actinic keratosis: Secondary | ICD-10-CM | POA: Diagnosis not present

## 2023-04-30 IMAGING — DX DG CHEST 2V
2 series · 2 of 2 positions shown · non-contrast
Comparison: CT May 20, 2021.

CLINICAL DATA: Severe aortic stenosis.

EXAM:
CHEST - 2 VIEW

[w chest pa]
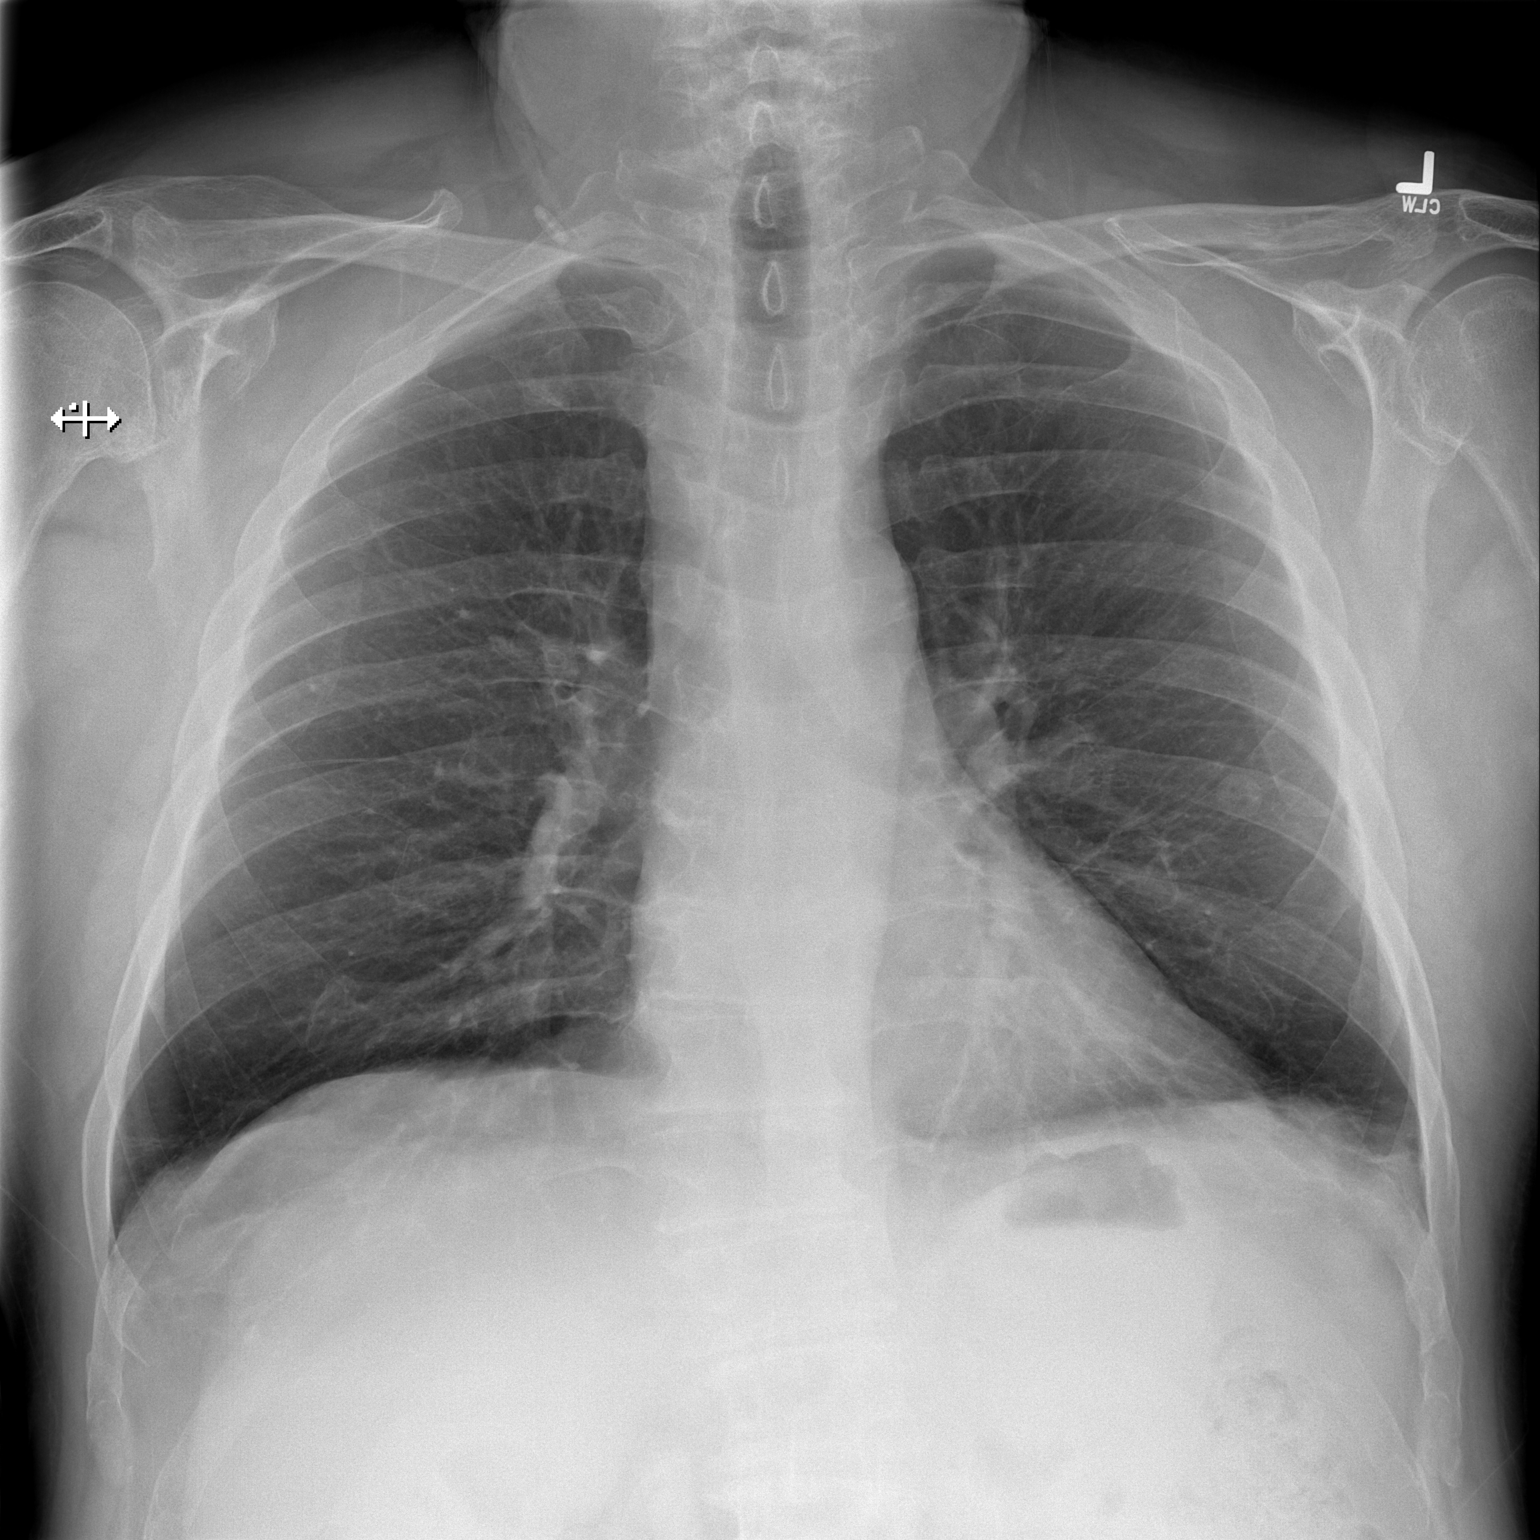

[w chest lat]
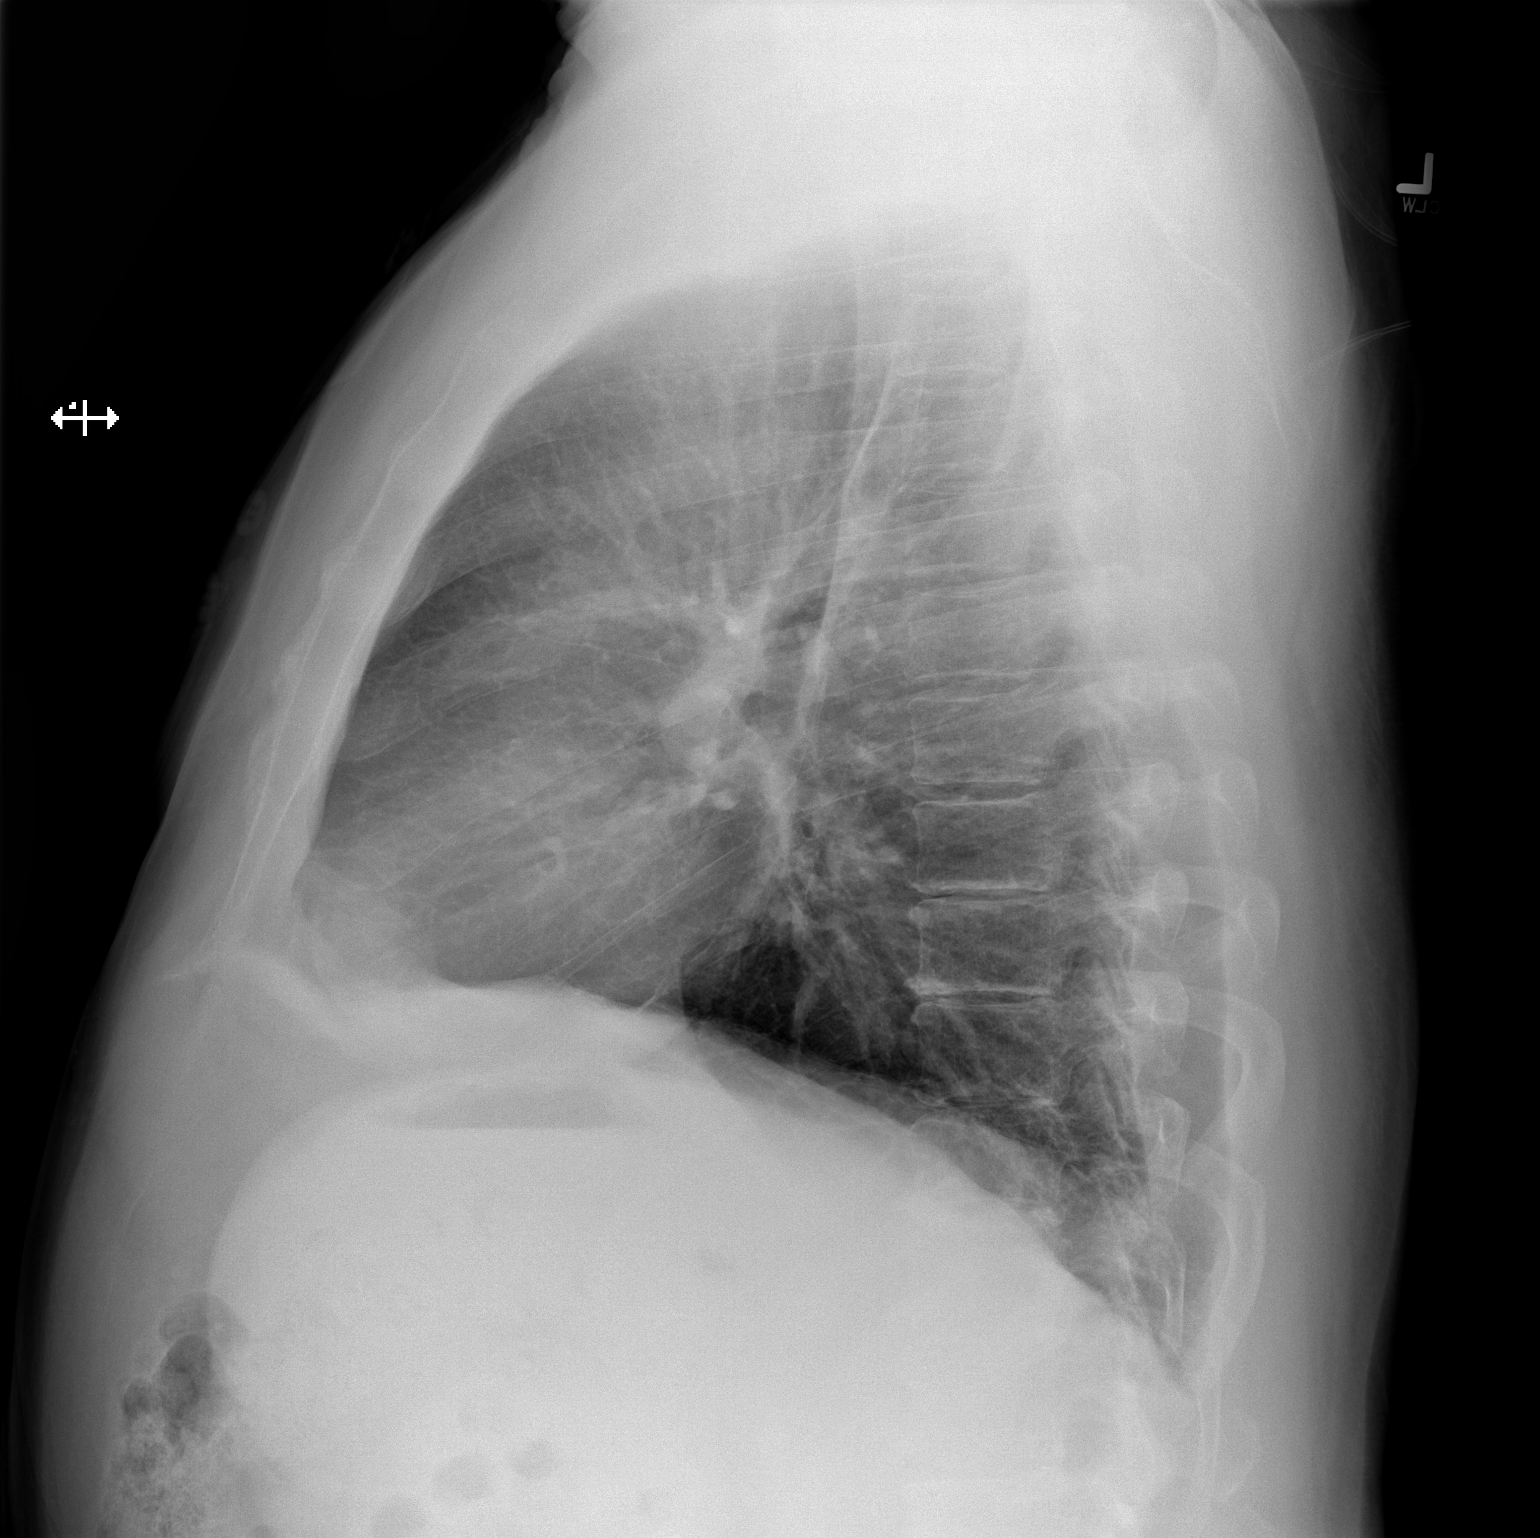

[2 of 2 positions shown; findings below may reference images not displayed]

FINDINGS: The heart size and mediastinal contours are within normal limits. No
focal consolidation. No pleural effusion. No pneumothorax. The
visualized skeletal structures are unremarkable.
IMPRESSION: No acute process in the chest.

## 2023-05-02 DIAGNOSIS — E1029 Type 1 diabetes mellitus with other diabetic kidney complication: Secondary | ICD-10-CM | POA: Diagnosis not present

## 2023-05-05 DIAGNOSIS — M791 Myalgia, unspecified site: Secondary | ICD-10-CM | POA: Diagnosis not present

## 2023-05-05 DIAGNOSIS — M5416 Radiculopathy, lumbar region: Secondary | ICD-10-CM | POA: Diagnosis not present

## 2023-05-05 DIAGNOSIS — M961 Postlaminectomy syndrome, not elsewhere classified: Secondary | ICD-10-CM | POA: Diagnosis not present

## 2023-05-10 DIAGNOSIS — E1029 Type 1 diabetes mellitus with other diabetic kidney complication: Secondary | ICD-10-CM | POA: Diagnosis not present

## 2023-05-11 DIAGNOSIS — R809 Proteinuria, unspecified: Secondary | ICD-10-CM | POA: Diagnosis not present

## 2023-05-11 DIAGNOSIS — E1029 Type 1 diabetes mellitus with other diabetic kidney complication: Secondary | ICD-10-CM | POA: Diagnosis not present

## 2023-05-11 DIAGNOSIS — I10 Essential (primary) hypertension: Secondary | ICD-10-CM | POA: Diagnosis not present

## 2023-05-11 DIAGNOSIS — E785 Hyperlipidemia, unspecified: Secondary | ICD-10-CM | POA: Diagnosis not present

## 2023-05-12 DIAGNOSIS — R1314 Dysphagia, pharyngoesophageal phase: Secondary | ICD-10-CM | POA: Diagnosis not present

## 2023-05-12 DIAGNOSIS — K219 Gastro-esophageal reflux disease without esophagitis: Secondary | ICD-10-CM | POA: Diagnosis not present

## 2023-05-12 DIAGNOSIS — R0683 Snoring: Secondary | ICD-10-CM | POA: Diagnosis not present

## 2023-05-13 ENCOUNTER — Ambulatory Visit (INDEPENDENT_AMBULATORY_CARE_PROVIDER_SITE_OTHER)

## 2023-05-13 ENCOUNTER — Ambulatory Visit: Admission: EM | Admit: 2023-05-13 | Discharge: 2023-05-13 | Disposition: A | Discharge status: Home / Self Care

## 2023-05-13 DIAGNOSIS — M19042 Primary osteoarthritis, left hand: Secondary | ICD-10-CM | POA: Diagnosis not present

## 2023-05-13 DIAGNOSIS — S61211A Laceration without foreign body of left index finger without damage to nail, initial encounter: Secondary | ICD-10-CM

## 2023-05-13 DIAGNOSIS — Z23 Encounter for immunization: Secondary | ICD-10-CM

## 2023-05-13 DIAGNOSIS — S6990XA Unspecified injury of unspecified wrist, hand and finger(s), initial encounter: Secondary | ICD-10-CM

## 2023-05-13 DIAGNOSIS — S6992XA Unspecified injury of left wrist, hand and finger(s), initial encounter: Secondary | ICD-10-CM

## 2023-05-13 DIAGNOSIS — M19011 Primary osteoarthritis, right shoulder: Secondary | ICD-10-CM | POA: Diagnosis not present

## 2023-05-13 DIAGNOSIS — M19012 Primary osteoarthritis, left shoulder: Secondary | ICD-10-CM | POA: Diagnosis not present

## 2023-05-13 MED ORDER — CEPHALEXIN 500 MG PO CAPS: 500.0000 mg | ORAL_CAPSULE | Freq: Three times a day (TID) | ORAL | 0 refills | Status: AC

## 2023-05-13 MED ORDER — TETANUS-DIPHTH-ACELL PERTUSSIS 5-2.5-18.5 LF-MCG/0.5 IM SUSY
0.5000 mL | PREFILLED_SYRINGE | Freq: Once | INTRAMUSCULAR | Status: AC
  Administered 2023-05-13: 0.5 mL via INTRAMUSCULAR

## 2023-05-13 NOTE — ED Provider Notes (Addendum)
 [facility]    CSN: [id number] Arrival date & time: 05/13/23  1328      History   Chief Complaint Chief Complaint  Patient presents with   Trauma    HPI Frank Mckee is a 73 y.o. male.   HPI  73 year old male with past medical history significant for hypertension, high cholesterol, Graves' disease, and diabetes presents for evaluation of a skin avulsion that he sustained to his left index finger approximately 90 minutes prior to arrival.  He reports that he was loading a fertilizer spreader and his finger became caught between a plastic tub and a spreader.  He pulled his finger back and lacerated a flap of skin.  He does not have any active bleeding.  He denies any numbness or tingling.  He is unsure of when his last tetanus shot was.  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 Lynnette Caffey & Dr. 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    Past Surgical History:  Procedure Laterality Date   ABDOMINAL AORTOGRAM N/A 05/01/2021   Procedure: ABDOMINAL AORTOGRAM;  Surgeon: Orbie Pyo, MD;  [facility];  Service: Cardiovascular;  Laterality: N/A;   arm surgery     BACK SURGERY     COLONOSCOPY WITH PROPOFOL N/A 02/17/2017   Procedure: COLONOSCOPY WITH PROPOFOL;  Surgeon: Toledo, Boykin Nearing, MD;  Location: [facility];  Service: Gastroenterology;  Laterality: N/A;   ENDOSCOPIC  CONCHA BULLOSA RESECTION Right 05/30/2020   Procedure: ENDOSCOPIC CONCHA BULLOSA RESECTION;  Surgeon: Vernie Murders, MD;  Location: [facility];  Service: ENT;  Laterality: Right;   EXCISION NASAL MASS Left 05/30/2020   Procedure: EXCISION NASAL MASS;  Surgeon: Vernie Murders, MD;  Location: [facility];  Service: ENT;  Laterality: Left;   EYE SURGERY     cataract extraction   INTRAOPERATIVE TRANSTHORACIC ECHOCARDIOGRAM N/A 05/27/2021   Procedure: INTRAOPERATIVE TRANSTHORACIC ECHOCARDIOGRAM;  Surgeon: Orbie Pyo, MD;  Location: [facility] OR;  Service: Open Heart Surgery;  Laterality: N/A;   NASAL SEPTOPLASTY W/ TURBINOPLASTY N/A 05/30/2020   Procedure: NASAL SEPTOPLASTY WITH TURBINATE REDUCTION;  Surgeon: Vernie Murders, MD;  Location: [facility];  Service: ENT;  Laterality: N/A;   RIGHT/LEFT HEART CATH AND CORONARY ANGIOGRAPHY N/A 05/01/2021   Procedure: RIGHT/LEFT HEART CATH AND CORONARY ANGIOGRAPHY;  Surgeon: Orbie Pyo, MD;  [facility];  Service: Cardiovascular;  Laterality: N/A;   SHOULDER SURGERY     TEE WITHOUT CARDIOVERSION N/A 03/01/2020   Procedure: TRANSESOPHAGEAL ECHOCARDIOGRAM (TEE);  Surgeon: Antonieta Iba, MD;  Location: [facility] ORS;  Service: Cardiovascular;  Laterality: N/A;   TRANSCATHETER AORTIC VALVE REPLACEMENT, TRANSFEMORAL N/A 05/27/2021   Procedure: Transcatheter Aortic Valve Replacement, Transfemoral;  Surgeon: Orbie Pyo, MD;  Location: [facility] OR;  Service: Open Heart Surgery;  Laterality: N/A;   ULTRASOUND GUIDANCE FOR VASCULAR ACCESS Bilateral 05/27/2021  Procedure: ULTRASOUND GUIDANCE FOR VASCULAR ACCESS;  Surgeon: Orbie Pyo, MD;  Location: [facility] OR;  Service: Open Heart Surgery;  Laterality: Bilateral;       Home Medications    Prior to Admission medications   Medication Sig Start Date End Date Taking? Authorizing Provider  aspirin EC 81 MG tablet Take 81 mg by mouth daily. Swallow whole.   Yes [provider]  cephALEXin (KEFLEX) 500 MG capsule Take 1 capsule (500 mg total) by mouth 3 (three) times daily for 5 days. 05/13/23 05/18/23 Yes Becky Augusta, NP  Continuous Glucose Sensor (DEXCOM G7 SENSOR) MISC Use   Yes [provider]  EPINEPHrine (EPIPEN 2-PAK) 0.3 mg/0.3 mL IJ SOAJ injection Inject 0.3 mg into the muscle as needed for anaphylaxis. 10/17/19  Yes Willy Eddy, MD  ezetimibe (ZETIA) 10 MG tablet Take 5 mg by mouth daily.   Yes [provider]  fexofenadine (ALLEGRA) 180 MG tablet Take 180 mg by mouth daily.   Yes [provider]  insulin aspart (NOVOLOG) 100 UNIT/ML injection INJECT UP TO 60 UNITS UNDER THE SKIN VIA PUMP AS DIRECTED   Yes [provider]  losartan (COZAAR) 50 MG tablet TAKE 1 TABLET(50 MG) BY MOUTH DAILY 10/19/22  Yes Gollan, Tollie Pizza, MD  methimazole (TAPAZOLE) 10 MG tablet Take 10-15 mg by mouth. Take 10 mg by mouth every Sunday alternating with 5 mg on all other days 05/17/19  Yes [provider]  Multiple Vitamin (MULTIVITAMIN) tablet Take 1 tablet by mouth daily.   Yes [provider]  tamsulosin (FLOMAX) 0.4 MG CAPS capsule Take 0.4 mg by mouth every evening. 02/18/19  Yes [provider]  triamcinolone cream (KENALOG) 0.1 % Apply 1 application. topically daily as needed (Rash). 09/14/16  Yes [provider]    Family History Family History  Problem Relation Age of Onset   Healthy Mother    Diabetes Father    Hypertension Father     Social History Social History   Tobacco Use   Smoking status: Never   Smokeless tobacco: Never  Vaping Use   Vaping status: Never Used  Substance Use Topics   Alcohol use: No   Drug use: No     Allergies   Atorvastatin and Ezetimibe   Review of Systems Review of Systems  Skin:  Positive for color change and wound.  Neurological:  Negative for weakness and numbness.     Physical Exam Triage Vital Signs ED Triage Vitals   Encounter Vitals Group     BP      Systolic BP Percentile      Diastolic BP Percentile      Pulse      Resp      Temp      Temp src      SpO2      Weight      Height      Head Circumference      Peak Flow      Pain Score      Pain Loc      Pain Education      Exclude from Growth Chart    No data found.  Updated Vital Signs BP (!) 147/72   Pulse 77   Temp 98.7 F (37.1 C)   Ht 5\' 9"  (1.753 m)   Wt 199 lb (90.3 kg)   SpO2 95%   BMI 29.39 kg/m   Visual Acuity Right Eye Distance:   Left Eye Distance:  Bilateral Distance:    Right Eye Near:   Left Eye Near:    Bilateral Near:     Physical Exam Vitals and nursing note reviewed.  Constitutional:      Appearance: Normal appearance. He is not ill-appearing.  HENT:     Head: Normocephalic and atraumatic.  Musculoskeletal:        General: Tenderness and signs of injury present. No swelling or deformity. Normal range of motion.  Skin:    General: Skin is warm and dry.     Capillary Refill: Capillary refill takes less than 2 seconds.     Findings: Bruising present.  Neurological:     General: No focal deficit present.     Mental Status: He is alert and oriented to person, place, and time.      UC Treatments / Results  Labs (all labs ordered are listed, but only abnormal results are displayed) Labs Reviewed - No data to display  EKG   Radiology No results found.  Procedures Procedures (including critical care time)  Medications Ordered in UC Medications  Tdap (BOOSTRIX) injection 0.5 mL (has no administration in time range)    Initial Impression / Assessment and Plan / UC Course  I have reviewed the triage vital signs and the nursing notes.  Pertinent labs & imaging results that were available during my care of the patient were reviewed by me and considered in my medical decision making (see chart for details).   Patient is a pleasant, nontoxic-appearing 73 year old male presenting for  evaluation of left index finger injury as outlined in the HPI above.  As you can see in the image above, there is a large skin flap that extends approximately from the DIP joint, over the PIP joint, into the proximal phalanx.  Patient has range of motion of his finger and full sensation.  Cap refills less than 2 seconds.  He is currently soaking his finger in chlorhexidine and water.  I will obtain radiograph of the left index finger to rule out any underlying bony abnormality.  The patient reports that he does have a difficult time getting numb with lidocaine.  I suspect the patient may be experiencing Kayne resistance.  I will attempt to infiltrate with a 10% solution of Benadryl.  Left index finger x-rays independently reviewed and evaluated by me.  Impression: No evidence of fracture or dislocation.  There are degenerative changes throughout the joints of the left index finger.  Radiology overread is pending. Radiology impression states mild degenerative changes at the distal IP joint.  The wound was infiltrated with 3 mL of 10% diphenhydramine solution.  Once good anesthesia was achieved a turnicot was applied, the wound was draped in a sterile fashion, the wound was cleansed with chlorhexidine saline, and the skin flap was retracted.  The underlying tissue was cleansed with chlorhexidine and saline.  There is no visible bone or tendon injury.  The flap was then tacked down using 6, simple interrupted sutures of 4-0 Prolene.  As you can see in image above, there is a tissue deficit at the proximal aspect.  I will have staff apply a nonadherent dressing to the wound.  I have advised the patient to leave the dressing in place for the next 24 hours and then gently remove the dressing, wash the area gently with soap and water, pat it dry, and apply more bacitracin and a nonadherent dressing.  He is to do this until a scab has formed.  Once the scab  is formed he can leave the wound open to air when at home  and cover it with a dressing when out in public or at work.  Stitches will remain in place for 14 days.  I will discharge the patient home on Keflex 500 mg 3 times daily x 5 days to prevent infection.   Final Clinical Impressions(s) / UC Diagnoses   Final diagnoses:  Finger injury, initial encounter  Laceration of left index finger without foreign body without damage to nail, initial encounter     Discharge Instructions      Leave the dressing in place for the next 24 hours.  After 24 hours remove the dressing, wash the wound with warm water and soap, pat it dry, and apply a thin smear of bacitracin.  Clean the wound daily and apply bacitracin for the first 2 days.  After that a scab should have started to form and you can leave the wound open to air when you are at home and cover with a Band-Aid when you go out in public.  Take the Keflex three times daily for 5 days for prevention of wound infection.  Sutures remain in place for 14 days, please return here or see your primary care provider for removal.  If you develop any redness at the wound site, swelling, pain, drainage, red streaks going up your hand, or fever please return for reevaluation.      ED Prescriptions     Medication Sig Dispense Auth. Provider   cephALEXin (KEFLEX) 500 MG capsule Take 1 capsule (500 mg total) by mouth 3 (three) times daily for 5 days. 15 capsule Becky Augusta, NP      PDMP not reviewed this encounter.   Becky Augusta, NP 05/13/23 1517    Becky Augusta, NP 05/13/23 [phone]

## 2023-05-13 NOTE — Discharge Instructions (Addendum)
Leave the dressing in place for the next 24 hours.  After 24 hours remove the dressing, wash the wound with warm water and soap, pat it dry, and apply a thin smear of bacitracin.  Clean the wound daily and apply bacitracin for the first 2 days.  After that a scab should have started to form and you can leave the wound open to air when you are at home and cover with a Band-Aid when you go out in public.  Take the Keflex three times daily for 5 days for prevention of wound infection.  Sutures remain in place for 14 days, please return here or see your primary care provider for removal.  If you develop any redness at the wound site, swelling, pain, drainage, red streaks going up your hand, or fever please return for reevaluation.

## 2023-05-13 NOTE — ED Triage Notes (Addendum)
 Pt states that he was loading his truck and got his finger caught while loading a 500lb fertilizer spreader at 1pm. Pt then pulled his finger out from the fertilizer spreader and pulled the skin off of his finger.   Pt denies pain in his hand or other fingers, and states that the pain is only along the wound.

## 2023-05-21 DIAGNOSIS — M5416 Radiculopathy, lumbar region: Secondary | ICD-10-CM | POA: Diagnosis not present

## 2023-06-02 DIAGNOSIS — E1029 Type 1 diabetes mellitus with other diabetic kidney complication: Secondary | ICD-10-CM | POA: Diagnosis not present

## 2023-06-10 DIAGNOSIS — M19011 Primary osteoarthritis, right shoulder: Secondary | ICD-10-CM | POA: Diagnosis not present

## 2023-06-10 DIAGNOSIS — M19012 Primary osteoarthritis, left shoulder: Secondary | ICD-10-CM | POA: Diagnosis not present

## 2023-06-22 ENCOUNTER — Ambulatory Visit: Payer: Medicare HMO | Admitting: Neurosurgery

## 2023-06-23 DIAGNOSIS — I10 Essential (primary) hypertension: Secondary | ICD-10-CM | POA: Diagnosis not present

## 2023-06-23 DIAGNOSIS — R972 Elevated prostate specific antigen [PSA]: Secondary | ICD-10-CM | POA: Diagnosis not present

## 2023-06-23 DIAGNOSIS — E785 Hyperlipidemia, unspecified: Secondary | ICD-10-CM | POA: Diagnosis not present

## 2023-06-30 DIAGNOSIS — M791 Myalgia, unspecified site: Secondary | ICD-10-CM | POA: Diagnosis not present

## 2023-06-30 DIAGNOSIS — M961 Postlaminectomy syndrome, not elsewhere classified: Secondary | ICD-10-CM | POA: Diagnosis not present

## 2023-06-30 DIAGNOSIS — M5416 Radiculopathy, lumbar region: Secondary | ICD-10-CM | POA: Diagnosis not present

## 2023-07-01 DIAGNOSIS — N400 Enlarged prostate without lower urinary tract symptoms: Secondary | ICD-10-CM | POA: Diagnosis not present

## 2023-07-01 DIAGNOSIS — M5416 Radiculopathy, lumbar region: Secondary | ICD-10-CM | POA: Diagnosis not present

## 2023-07-01 DIAGNOSIS — E109 Type 1 diabetes mellitus without complications: Secondary | ICD-10-CM | POA: Diagnosis not present

## 2023-07-01 DIAGNOSIS — Z1331 Encounter for screening for depression: Secondary | ICD-10-CM | POA: Diagnosis not present

## 2023-07-01 DIAGNOSIS — E0590 Thyrotoxicosis, unspecified without thyrotoxic crisis or storm: Secondary | ICD-10-CM | POA: Diagnosis not present

## 2023-07-01 DIAGNOSIS — I10 Essential (primary) hypertension: Secondary | ICD-10-CM | POA: Diagnosis not present

## 2023-07-01 DIAGNOSIS — I350 Nonrheumatic aortic (valve) stenosis: Secondary | ICD-10-CM | POA: Diagnosis not present

## 2023-07-01 DIAGNOSIS — E785 Hyperlipidemia, unspecified: Secondary | ICD-10-CM | POA: Diagnosis not present

## 2023-07-02 DIAGNOSIS — E1029 Type 1 diabetes mellitus with other diabetic kidney complication: Secondary | ICD-10-CM | POA: Diagnosis not present

## 2023-07-05 DIAGNOSIS — E1069 Type 1 diabetes mellitus with other specified complication: Secondary | ICD-10-CM | POA: Diagnosis not present

## 2023-07-05 DIAGNOSIS — E785 Hyperlipidemia, unspecified: Secondary | ICD-10-CM | POA: Diagnosis not present

## 2023-07-05 DIAGNOSIS — R809 Proteinuria, unspecified: Secondary | ICD-10-CM | POA: Diagnosis not present

## 2023-07-05 DIAGNOSIS — I152 Hypertension secondary to endocrine disorders: Secondary | ICD-10-CM | POA: Diagnosis not present

## 2023-07-05 DIAGNOSIS — E1029 Type 1 diabetes mellitus with other diabetic kidney complication: Secondary | ICD-10-CM | POA: Diagnosis not present

## 2023-07-05 DIAGNOSIS — E0590 Thyrotoxicosis, unspecified without thyrotoxic crisis or storm: Secondary | ICD-10-CM | POA: Diagnosis not present

## 2023-07-07 DIAGNOSIS — E1029 Type 1 diabetes mellitus with other diabetic kidney complication: Secondary | ICD-10-CM | POA: Diagnosis not present

## 2023-07-07 DIAGNOSIS — I152 Hypertension secondary to endocrine disorders: Secondary | ICD-10-CM | POA: Diagnosis not present

## 2023-07-07 DIAGNOSIS — E0590 Thyrotoxicosis, unspecified without thyrotoxic crisis or storm: Secondary | ICD-10-CM | POA: Diagnosis not present

## 2023-07-07 DIAGNOSIS — E1069 Type 1 diabetes mellitus with other specified complication: Secondary | ICD-10-CM | POA: Diagnosis not present

## 2023-07-07 DIAGNOSIS — R809 Proteinuria, unspecified: Secondary | ICD-10-CM | POA: Diagnosis not present

## 2023-07-07 DIAGNOSIS — E785 Hyperlipidemia, unspecified: Secondary | ICD-10-CM | POA: Diagnosis not present

## 2023-07-21 DIAGNOSIS — H5789 Other specified disorders of eye and adnexa: Secondary | ICD-10-CM | POA: Diagnosis not present

## 2023-07-21 DIAGNOSIS — E079 Disorder of thyroid, unspecified: Secondary | ICD-10-CM | POA: Diagnosis not present

## 2023-07-22 DIAGNOSIS — M19011 Primary osteoarthritis, right shoulder: Secondary | ICD-10-CM | POA: Diagnosis not present

## 2023-07-30 DIAGNOSIS — E1065 Type 1 diabetes mellitus with hyperglycemia: Secondary | ICD-10-CM | POA: Diagnosis not present

## 2023-08-02 DIAGNOSIS — E1029 Type 1 diabetes mellitus with other diabetic kidney complication: Secondary | ICD-10-CM | POA: Diagnosis not present

## 2023-08-08 DIAGNOSIS — E1029 Type 1 diabetes mellitus with other diabetic kidney complication: Secondary | ICD-10-CM | POA: Diagnosis not present

## 2023-08-09 DIAGNOSIS — M19012 Primary osteoarthritis, left shoulder: Secondary | ICD-10-CM | POA: Diagnosis not present

## 2023-08-09 DIAGNOSIS — M19011 Primary osteoarthritis, right shoulder: Secondary | ICD-10-CM | POA: Diagnosis not present

## 2023-08-26 DIAGNOSIS — M961 Postlaminectomy syndrome, not elsewhere classified: Secondary | ICD-10-CM | POA: Diagnosis not present

## 2023-08-26 DIAGNOSIS — M791 Myalgia, unspecified site: Secondary | ICD-10-CM | POA: Diagnosis not present

## 2023-08-26 DIAGNOSIS — M5416 Radiculopathy, lumbar region: Secondary | ICD-10-CM | POA: Diagnosis not present

## 2023-09-01 DIAGNOSIS — E1029 Type 1 diabetes mellitus with other diabetic kidney complication: Secondary | ICD-10-CM | POA: Diagnosis not present

## 2023-09-03 DIAGNOSIS — M5416 Radiculopathy, lumbar region: Secondary | ICD-10-CM | POA: Diagnosis not present

## 2023-09-03 DIAGNOSIS — E119 Type 2 diabetes mellitus without complications: Secondary | ICD-10-CM | POA: Diagnosis not present

## 2023-09-09 DIAGNOSIS — E119 Type 2 diabetes mellitus without complications: Secondary | ICD-10-CM | POA: Diagnosis not present

## 2023-09-09 DIAGNOSIS — M19012 Primary osteoarthritis, left shoulder: Secondary | ICD-10-CM | POA: Diagnosis not present

## 2023-10-02 DIAGNOSIS — E1029 Type 1 diabetes mellitus with other diabetic kidney complication: Secondary | ICD-10-CM | POA: Diagnosis not present

## 2023-10-13 DIAGNOSIS — I10 Essential (primary) hypertension: Secondary | ICD-10-CM | POA: Diagnosis not present

## 2023-10-13 DIAGNOSIS — E785 Hyperlipidemia, unspecified: Secondary | ICD-10-CM | POA: Diagnosis not present

## 2023-10-13 DIAGNOSIS — E1029 Type 1 diabetes mellitus with other diabetic kidney complication: Secondary | ICD-10-CM | POA: Diagnosis not present

## 2023-10-13 DIAGNOSIS — R809 Proteinuria, unspecified: Secondary | ICD-10-CM | POA: Diagnosis not present

## 2023-10-22 DIAGNOSIS — E1029 Type 1 diabetes mellitus with other diabetic kidney complication: Secondary | ICD-10-CM | POA: Diagnosis not present

## 2023-10-22 DIAGNOSIS — E785 Hyperlipidemia, unspecified: Secondary | ICD-10-CM | POA: Diagnosis not present

## 2023-10-22 DIAGNOSIS — E0590 Thyrotoxicosis, unspecified without thyrotoxic crisis or storm: Secondary | ICD-10-CM | POA: Diagnosis not present

## 2023-10-22 DIAGNOSIS — R809 Proteinuria, unspecified: Secondary | ICD-10-CM | POA: Diagnosis not present

## 2023-10-22 DIAGNOSIS — E1069 Type 1 diabetes mellitus with other specified complication: Secondary | ICD-10-CM | POA: Diagnosis not present

## 2023-10-22 DIAGNOSIS — I152 Hypertension secondary to endocrine disorders: Secondary | ICD-10-CM | POA: Diagnosis not present

## 2023-11-02 DIAGNOSIS — E1029 Type 1 diabetes mellitus with other diabetic kidney complication: Secondary | ICD-10-CM | POA: Diagnosis not present

## 2023-11-04 DIAGNOSIS — E1065 Type 1 diabetes mellitus with hyperglycemia: Secondary | ICD-10-CM | POA: Diagnosis not present

## 2023-11-06 DIAGNOSIS — E1029 Type 1 diabetes mellitus with other diabetic kidney complication: Secondary | ICD-10-CM | POA: Diagnosis not present

## 2023-11-08 DIAGNOSIS — H903 Sensorineural hearing loss, bilateral: Secondary | ICD-10-CM | POA: Diagnosis not present

## 2023-11-11 DIAGNOSIS — Z1331 Encounter for screening for depression: Secondary | ICD-10-CM | POA: Diagnosis not present

## 2023-11-11 DIAGNOSIS — N410 Acute prostatitis: Secondary | ICD-10-CM | POA: Diagnosis not present

## 2023-11-11 DIAGNOSIS — R350 Frequency of micturition: Secondary | ICD-10-CM | POA: Diagnosis not present

## 2023-11-25 DIAGNOSIS — M791 Myalgia, unspecified site: Secondary | ICD-10-CM | POA: Diagnosis not present

## 2023-11-25 DIAGNOSIS — M47816 Spondylosis without myelopathy or radiculopathy, lumbar region: Secondary | ICD-10-CM | POA: Diagnosis not present

## 2023-11-25 DIAGNOSIS — M961 Postlaminectomy syndrome, not elsewhere classified: Secondary | ICD-10-CM | POA: Diagnosis not present

## 2023-11-26 DIAGNOSIS — H34812 Central retinal vein occlusion, left eye, with macular edema: Secondary | ICD-10-CM | POA: Diagnosis not present

## 2023-11-28 ENCOUNTER — Other Ambulatory Visit: Payer: Self-pay | Admitting: Cardiovascular Disease

## 2023-12-02 DIAGNOSIS — E1029 Type 1 diabetes mellitus with other diabetic kidney complication: Secondary | ICD-10-CM | POA: Diagnosis not present

## 2023-12-10 DIAGNOSIS — M5416 Radiculopathy, lumbar region: Secondary | ICD-10-CM | POA: Diagnosis not present

## 2023-12-13 DIAGNOSIS — M7989 Other specified soft tissue disorders: Secondary | ICD-10-CM | POA: Diagnosis not present

## 2023-12-13 DIAGNOSIS — M19011 Primary osteoarthritis, right shoulder: Secondary | ICD-10-CM | POA: Diagnosis not present

## 2023-12-27 NOTE — Progress Notes (Unsigned)
 Cardiology Office Note  Date:  12/28/2023   ID:  Frank, Mckee 15-Sep-1950, MRN [id number]  PCP:  Jeffie Cheryl BRAVO, MD   Chief Complaint  Patient presents with   12 month follow up     Doing well.     HPI:  [name] is a 73 year old gentleman with past medical history of Diabetes type 1, on insulin  pump Hyperlipidemia Hypertension severe aortic valve stenosis, s/p TAVR (05/27/21)  26 mm Edwards Sapien 3 Ultra Resilia THV via the TF approach Thyroid  disease/Graves': started on intermittent infusions of Tepezza  every 3 weeks  Who presents by referral from primary care for tachycardia, shortness of breath, hyper thyroidism,  severe aortic valve stenosis, s/p TAVR  Last seen by myself in clinic September 2024 In follow-up today reports feeling well Weight down 15 pounds through dietary changes Active at baseline, frequently traveling to the [employer] to go hunting, deer  Lower back pain, limiting his activity Runs a farm  Prior history bilateral shoulder surgery Needs to have redo, has been followed by [facility], Dr. Nappo MRI scheduled later today   echocardiogram April 2024 Stable prosthetic valve, no significant gradient  Lab work reviewed A1c 6.7 Total cholesterol 144 LDL 84 TSH 3.5  In terms of Graves' disease Remains on methimazole   EKG personally reviewed by myself on todays visit EKG Interpretation Date/Time:  Tuesday December 28 2023 08:51:56 EST Ventricular Rate:  63 PR Interval:  152 QRS Duration:  88 QT Interval:  406 QTC Calculation: 415 R Axis:   10  Text Interpretation: Normal sinus rhythm Normal ECG When compared with ECG of 19-Oct-2022 07:59, No significant change was found Confirmed by Perla Lye [phone]) on 12/28/2023 8:56:16 AM    Cardiac catheterization May 01, 2021   There is severe aortic valve stenosis. 1.  Normal left dominant circulation; the right coronary artery could not be engaged selectively  however nonselective injection demonstrated this to be small and nondominant. 2.  Normal cardiac output and index with normal filling pressures 3.  Severe aortic stenosis with peak to peak gradient of 80 mmHg. 4.  Capacious iliofemoral vessels bilaterally.    PMH:   has a past medical history of Diabetes mellitus without complication (HCC), Graves disease, Graves' eye disease, Hypercholesteremia, Hypertension, S/P TAVR (transcatheter aortic valve replacement) (05/27/2021), and Severe aortic stenosis.  PSH:    Past Surgical History:  Procedure Laterality Date   ABDOMINAL AORTOGRAM N/A 05/01/2021   Procedure: ABDOMINAL AORTOGRAM;  Surgeon: Wendel Lurena POUR, MD;  [facility];  Service: Cardiovascular;  Laterality: N/A;   arm surgery     BACK SURGERY     COLONOSCOPY WITH PROPOFOL  N/A 02/17/2017   Procedure: COLONOSCOPY WITH PROPOFOL ;  Surgeon: Toledo, Ladell POUR, MD;  Location: [facility] ENDOSCOPY;  Service: Gastroenterology;  Laterality: N/A;   ENDOSCOPIC CONCHA BULLOSA RESECTION Right 05/30/2020   Procedure: ENDOSCOPIC CONCHA BULLOSA RESECTION;  Surgeon: Edda Mt, MD;  Location: [facility];  Service: ENT;  Laterality: Right;   EXCISION NASAL MASS Left 05/30/2020   Procedure: EXCISION NASAL MASS;  Surgeon: Edda Mt, MD;  Location: [facility];  Service: ENT;  Laterality: Left;   EYE SURGERY     cataract extraction   INTRAOPERATIVE TRANSTHORACIC ECHOCARDIOGRAM N/A 05/27/2021   Procedure: INTRAOPERATIVE TRANSTHORACIC ECHOCARDIOGRAM;  Surgeon: Wendel Lurena POUR, MD;  Location: [facility] OR;  Service: Open Heart Surgery;  Laterality: N/A;   NASAL SEPTOPLASTY W/ TURBINOPLASTY N/A 05/30/2020   Procedure: NASAL SEPTOPLASTY WITH TURBINATE  REDUCTION;  Surgeon: Edda Mt, MD;  Location: [facility];  Service: ENT;  Laterality: N/A;   RIGHT/LEFT HEART CATH AND CORONARY ANGIOGRAPHY N/A 05/01/2021   Procedure: RIGHT/LEFT HEART CATH AND CORONARY ANGIOGRAPHY;  Surgeon:  Wendel Lurena POUR, MD;  [facility];  Service: Cardiovascular;  Laterality: N/A;   SHOULDER SURGERY     TEE WITHOUT CARDIOVERSION N/A 03/01/2020   Procedure: TRANSESOPHAGEAL ECHOCARDIOGRAM (TEE);  Surgeon: Perla Evalene PARAS, MD;  Location: [facility] ORS;  Service: Cardiovascular;  Laterality: N/A;   TRANSCATHETER AORTIC VALVE REPLACEMENT, TRANSFEMORAL N/A 05/27/2021   Procedure: Transcatheter Aortic Valve Replacement, Transfemoral;  Surgeon: Wendel Lurena POUR, MD;  Location: [facility] OR;  Service: Open Heart Surgery;  Laterality: N/A;   ULTRASOUND GUIDANCE FOR VASCULAR ACCESS Bilateral 05/27/2021   Procedure: ULTRASOUND GUIDANCE FOR VASCULAR ACCESS;  Surgeon: Wendel Lurena POUR, MD;  Location: [facility] OR;  Service: Open Heart Surgery;  Laterality: Bilateral;    Current Outpatient Medications  Medication Sig Dispense Refill   aspirin  EC 81 MG tablet Take 81 mg by mouth daily. Swallow whole.     Continuous Glucose Sensor (DEXCOM G7 SENSOR) MISC Use     EPINEPHrine  (EPIPEN  2-PAK) 0.3 mg/0.3 mL IJ SOAJ injection Inject 0.3 mg into the muscle as needed for anaphylaxis. 1 each 1   ezetimibe (ZETIA) 10 MG tablet Take 5 mg by mouth daily.     fexofenadine (ALLEGRA) 180 MG tablet Take 180 mg by mouth daily.     insulin  aspart (NOVOLOG ) 100 UNIT/ML injection INJECT UP TO 60 UNITS UNDER THE SKIN VIA PUMP AS DIRECTED     methimazole  (TAPAZOLE ) 10 MG tablet Take 10-15 mg by mouth. Take 10 mg by mouth every Sunday alternating with 5 mg on all other days     Multiple Vitamin (MULTIVITAMIN) tablet Take 1 tablet by mouth daily.     naftifine (NAFTIN) 1 % cream Apply 1 Application topically daily.     tamsulosin  (FLOMAX ) 0.4 MG CAPS capsule Take 0.4 mg by mouth every evening.     traMADol -acetaminophen  (ULTRACET) 37.5-325 MG tablet Take 1 tablet by mouth every 4 (four) hours as needed.     triamcinolone  cream (KENALOG) 0.1 % Apply 1 application. topically daily as needed (Rash).     latanoprost (XALATAN) 0.005 %  ophthalmic solution 1 drop at bedtime.     losartan  (COZAAR ) 50 MG tablet TAKE 1 TABLET(50 MG) BY MOUTH DAILY 90 tablet 3   omeprazole (PRILOSEC) 20 MG capsule Take 20 mg by mouth daily.     rosuvastatin (CRESTOR) 5 MG tablet Take 5 mg by mouth daily.     No current facility-administered medications for this visit.    Allergies:   Atorvastatin, Atorvastatin calcium, and Ezetimibe   Social History:  The patient  reports that he has never smoked. He has never used smokeless tobacco. He reports that he does not drink alcohol and does not use drugs.   Family History:   family history includes Diabetes in his father; Healthy in his mother; Hypertension in his father.    Review of Systems: Review of Systems  Constitutional: Negative.   HENT: Negative.    Respiratory: Negative.    Cardiovascular: Negative.   Gastrointestinal: Negative.   Musculoskeletal: Negative.   Neurological: Negative.   Psychiatric/Behavioral: Negative.    All other systems reviewed and are negative.   PHYSICAL EXAM: VS:  BP (!) 150/60 (BP Location: Left Arm, Patient Position: Sitting, Cuff Size: Normal)   Pulse 63  Ht 5' 9 (1.753 m)   Wt 190 lb (86.2 kg)   SpO2 95%   BMI 28.06 kg/m  , BMI Body mass index is 28.06 kg/m. Constitutional:  oriented to person, place, and time. No distress.  HENT:  Head: Normocephalic and atraumatic.  Eyes:  no discharge. No scleral icterus.  Neck: Normal range of motion. Neck supple. No JVD present.  Cardiovascular: Normal rate, regular rhythm, normal heart sounds and intact distal pulses. Exam reveals no gallop and no friction rub. No edema 1/6 SEM appreciated right sternal border Pulmonary/Chest: Effort normal and breath sounds normal. No stridor. No respiratory distress.  no wheezes.  no rales.  no tenderness.  Abdominal: Soft.  no distension.  no tenderness.  Musculoskeletal: Normal range of motion.  no  tenderness or deformity.  Neurological:  normal muscle tone.  Coordination normal. No atrophy Skin: Skin is warm and dry. No rash noted. not diaphoretic.  Psychiatric:  normal mood and affect. behavior is normal. Thought content normal.   Recent Labs: No results found for requested labs within last 365 days.    Lipid Panel No results found for: CHOL, HDL, LDLCALC, TRIG    Wt Readings from Last 3 Encounters:  12/28/23 190 lb (86.2 kg)  05/13/23 199 lb (90.3 kg)  02/18/23 209 lb 9.6 oz (95.1 kg)     ASSESSMENT AND PLAN:  Problem List Items Addressed This Visit       Cardiology Problems   Severe aortic stenosis - Primary   Relevant Medications   rosuvastatin (CRESTOR) 5 MG tablet   losartan  (COZAAR ) 50 MG tablet   Other Relevant Orders   EKG 12-Lead (Completed)     Other   S/P TAVR (transcatheter aortic valve replacement)   Relevant Orders   EKG 12-Lead (Completed)   Other Visit Diagnoses       Essential hypertension       Relevant Medications   rosuvastatin (CRESTOR) 5 MG tablet   losartan  (COZAAR ) 50 MG tablet   Other Relevant Orders   EKG 12-Lead (Completed)     Graves' ophthalmopathy         Diabetes mellitus type 2, insulin  dependent (HCC)       Relevant Medications   rosuvastatin (CRESTOR) 5 MG tablet   losartan  (COZAAR ) 50 MG tablet     Paroxysmal tachycardia (HCC)       Relevant Medications   rosuvastatin (CRESTOR) 5 MG tablet   losartan  (COZAAR ) 50 MG tablet   Other Relevant Orders   EKG 12-Lead (Completed)     Mixed hyperlipidemia       Relevant Medications   rosuvastatin (CRESTOR) 5 MG tablet   losartan  (COZAAR ) 50 MG tablet      Preop cardiovascular evaluation Acceptable risk for shoulder surgery No further cardiac testing needed No medications to hold in anticipation for surgery  Hyperthyroidism/Graves' disease,  Followed by endocrinology Remains on methimazole , TSH 3.5  Essential hypertension Blood pressure mildly elevated,  Recommend close monitoring at home, if it runs high we could  increase losartan  up to 50 twice daily  Tachycardia Tachycardia improved with treatment of hyperthyroidism Currently not on beta-blocker  severe aortic valve stenosis Severe by TTE, underwent TAVR April 2023 Asymptomatic, repeat echo 2026  Hyperlipidemia CT scan  minimal coronary calcification, minimal aortic atherosclerosis On statin, total cholesterol 144, no changes made  Diabetes type 2 on insulin  A1c 7.0 down to 6.7, weight is down 15 pounds from prior clinic visit  Lower extremity edema No significant  leg edema   Signed, Velinda Lunger, M.D., Ph.D. [facility] [place], [place] [phone]

## 2023-12-28 ENCOUNTER — Other Ambulatory Visit: Payer: Self-pay | Admitting: Emergency Medicine

## 2023-12-28 ENCOUNTER — Encounter: Payer: Self-pay | Admitting: Cardiovascular Disease

## 2023-12-28 ENCOUNTER — Ambulatory Visit: Attending: Cardiovascular Disease | Admitting: Cardiovascular Disease

## 2023-12-28 VITALS — BP 150/60 | HR 63 | Ht 69.0 in | Wt 190.0 lb

## 2023-12-28 DIAGNOSIS — H05839 Thyroid orbitopathy, unspecified orbit: Secondary | ICD-10-CM | POA: Diagnosis not present

## 2023-12-28 DIAGNOSIS — I10 Essential (primary) hypertension: Secondary | ICD-10-CM | POA: Diagnosis not present

## 2023-12-28 DIAGNOSIS — Z952 Presence of prosthetic heart valve: Secondary | ICD-10-CM

## 2023-12-28 DIAGNOSIS — E782 Mixed hyperlipidemia: Secondary | ICD-10-CM | POA: Diagnosis not present

## 2023-12-28 DIAGNOSIS — I350 Nonrheumatic aortic (valve) stenosis: Secondary | ICD-10-CM | POA: Diagnosis not present

## 2023-12-28 DIAGNOSIS — I479 Paroxysmal tachycardia, unspecified: Secondary | ICD-10-CM

## 2023-12-28 DIAGNOSIS — E0500 Thyrotoxicosis with diffuse goiter without thyrotoxic crisis or storm: Secondary | ICD-10-CM | POA: Diagnosis not present

## 2023-12-28 DIAGNOSIS — Z794 Long term (current) use of insulin: Secondary | ICD-10-CM | POA: Diagnosis not present

## 2023-12-28 DIAGNOSIS — E119 Type 2 diabetes mellitus without complications: Secondary | ICD-10-CM

## 2023-12-28 DIAGNOSIS — M25512 Pain in left shoulder: Secondary | ICD-10-CM | POA: Diagnosis not present

## 2023-12-28 MED ORDER — LOSARTAN POTASSIUM 50 MG PO TABS: ORAL_TABLET | ORAL | 3 refills | Status: AC

## 2023-12-28 NOTE — Addendum Note (Signed)
 Addended by: CRISTOPHER OLIVIA PARAS on: 12/28/2023 09:25 AM   Modules accepted: Orders

## 2023-12-28 NOTE — Patient Instructions (Addendum)
 Medication Instructions:  No changes  If you need a refill on your cardiac medications before your next appointment, please call your pharmacy.   Lab work: No new labs needed  Testing/Procedures: Echo for TAVR 4/26  Follow-Up: At [facility], you and your health needs are our priority.  As part of our continuing mission to provide you with exceptional heart care, we have created designated Provider Care Teams.  These Care Teams include your primary Cardiologist (physician) and Advanced Practice Providers (APPs -  Physician Assistants and Nurse Practitioners) who all work together to provide you with the care you need, when you need it.  You will need a follow up appointment in 12 months  Providers on your designated Care Team:   Lonni Meager, NP Bernardino Bring, PA-C Cadence [place], [place]  COVID-19 Vaccine Information can be found at: [email or web address] For questions related to vaccine distribution or appointments, please email [email or web address] or call [phone].

## 2024-01-06 DIAGNOSIS — M25819 Other specified joint disorders, unspecified shoulder: Secondary | ICD-10-CM | POA: Diagnosis not present

## 2024-01-06 DIAGNOSIS — M75102 Unspecified rotator cuff tear or rupture of left shoulder, not specified as traumatic: Secondary | ICD-10-CM | POA: Diagnosis not present

## 2024-01-21 NOTE — Progress Notes (Signed)
 Frank Mckee                                          MRN: [id number]   01/21/2024   The [facility] Quality Team Specialist reviewed this patient medical record for the purposes of chart review for care gap closure. The following were reviewed: abstraction for care gap closure-kidney health evaluation for diabetes:eGFR  and uACR.    [facility] Quality Team

## 2024-02-29 ENCOUNTER — Ambulatory Visit

## 2024-05-15 ENCOUNTER — Ambulatory Visit
